# Patient Record
Sex: Female | Born: 1975 | Race: Black or African American | Hispanic: No | Marital: Single | Smoking: Former smoker
Health system: Southern US, Community
[De-identification: ages and names within clinical notes are randomized; demographics above are authoritative.]

## PROBLEM LIST (undated history)

## (undated) DIAGNOSIS — R21 Rash and other nonspecific skin eruption: Secondary | ICD-10-CM

## (undated) DIAGNOSIS — Z789 Other specified health status: Secondary | ICD-10-CM

## (undated) DIAGNOSIS — D649 Anemia, unspecified: Secondary | ICD-10-CM

## (undated) DIAGNOSIS — K469 Unspecified abdominal hernia without obstruction or gangrene: Secondary | ICD-10-CM

## (undated) DIAGNOSIS — B029 Zoster without complications: Secondary | ICD-10-CM

## (undated) HISTORY — DX: Unspecified abdominal hernia without obstruction or gangrene: K46.9

## (undated) HISTORY — DX: Zoster without complications: B02.9

---

## 1998-01-21 ENCOUNTER — Emergency Department (HOSPITAL_COMMUNITY): Admission: EM | Admit: 1998-01-21 | Discharge: 1998-01-21 | Payer: Self-pay | Admitting: Emergency Medicine

## 1998-03-11 ENCOUNTER — Ambulatory Visit (HOSPITAL_COMMUNITY): Admission: RE | Admit: 1998-03-11 | Discharge: 1998-03-11 | Payer: Self-pay | Admitting: Obstetrics & Gynecology

## 1998-03-23 ENCOUNTER — Ambulatory Visit (HOSPITAL_COMMUNITY): Admission: RE | Admit: 1998-03-23 | Discharge: 1998-03-23 | Payer: Self-pay | Admitting: *Deleted

## 1998-05-06 ENCOUNTER — Ambulatory Visit (HOSPITAL_COMMUNITY): Admission: RE | Admit: 1998-05-06 | Discharge: 1998-05-06 | Payer: Self-pay

## 1998-05-06 ENCOUNTER — Encounter: Payer: Self-pay | Admitting: *Deleted

## 1998-09-10 ENCOUNTER — Inpatient Hospital Stay (HOSPITAL_COMMUNITY): Admission: AD | Admit: 1998-09-10 | Discharge: 1998-09-13 | Payer: Self-pay | Admitting: Obstetrics and Gynecology

## 1998-10-07 ENCOUNTER — Other Ambulatory Visit: Admission: RE | Admit: 1998-10-07 | Discharge: 1998-10-07 | Payer: Self-pay | Admitting: Obstetrics and Gynecology

## 2000-10-17 ENCOUNTER — Encounter: Payer: Self-pay | Admitting: Emergency Medicine

## 2000-10-17 ENCOUNTER — Emergency Department (HOSPITAL_COMMUNITY): Admission: EM | Admit: 2000-10-17 | Discharge: 2000-10-17 | Payer: Self-pay | Admitting: Emergency Medicine

## 2002-03-07 ENCOUNTER — Encounter: Payer: Self-pay | Admitting: Emergency Medicine

## 2002-03-07 ENCOUNTER — Emergency Department (HOSPITAL_COMMUNITY): Admission: EM | Admit: 2002-03-07 | Discharge: 2002-03-07 | Payer: Self-pay | Admitting: Emergency Medicine

## 2006-04-10 DIAGNOSIS — D649 Anemia, unspecified: Secondary | ICD-10-CM

## 2006-04-10 HISTORY — DX: Anemia, unspecified: D64.9

## 2007-04-09 ENCOUNTER — Inpatient Hospital Stay (HOSPITAL_COMMUNITY): Admission: RE | Admit: 2007-04-09 | Discharge: 2007-04-13 | Payer: Self-pay | Admitting: Orthopedic Surgery

## 2007-04-14 ENCOUNTER — Encounter (HOSPITAL_COMMUNITY): Admission: RE | Admit: 2007-04-14 | Discharge: 2007-04-28 | Payer: Self-pay | Admitting: Obstetrics & Gynecology

## 2010-07-12 ENCOUNTER — Inpatient Hospital Stay (HOSPITAL_COMMUNITY)
Admission: AD | Admit: 2010-07-12 | Discharge: 2010-07-12 | Disposition: A | Discharge status: Home / Self Care | Payer: 59 | Source: Ambulatory Visit | Attending: Obstetrics and Gynecology | Admitting: Obstetrics and Gynecology

## 2010-07-12 DIAGNOSIS — R109 Unspecified abdominal pain: Secondary | ICD-10-CM | POA: Insufficient documentation

## 2010-07-12 LAB — URINALYSIS, ROUTINE W REFLEX MICROSCOPIC
Bilirubin Urine: NEGATIVE
Glucose, UA: NEGATIVE mg/dL
Hgb urine dipstick: NEGATIVE
Ketones, ur: NEGATIVE mg/dL
Nitrite: NEGATIVE
Protein, ur: NEGATIVE mg/dL
Specific Gravity, Urine: 1.025 (ref 1.005–1.030)
Urobilinogen, UA: 0.2 mg/dL (ref 0.0–1.0)
pH: 6 (ref 5.0–8.0)

## 2010-07-12 LAB — CBC
HCT: 33.8 % — ABNORMAL LOW (ref 36.0–46.0)
Hemoglobin: 12.1 g/dL (ref 12.0–15.0)
MCH: 30.5 pg (ref 26.0–34.0)
MCHC: 35.8 g/dL (ref 30.0–36.0)
MCV: 85.1 fL (ref 78.0–100.0)
Platelets: 217 10*3/uL (ref 150–400)
RBC: 3.97 MIL/uL (ref 3.87–5.11)
RDW: 13.3 % (ref 11.5–15.5)
WBC: 7 10*3/uL (ref 4.0–10.5)

## 2010-07-12 LAB — DIFFERENTIAL
Basophils Absolute: 0 10*3/uL (ref 0.0–0.1)
Basophils Relative: 0 % (ref 0–1)
Eosinophils Absolute: 0.2 10*3/uL (ref 0.0–0.7)
Eosinophils Relative: 2 % (ref 0–5)
Lymphocytes Relative: 33 % (ref 12–46)
Lymphs Abs: 2.3 10*3/uL (ref 0.7–4.0)
Monocytes Absolute: 0.4 10*3/uL (ref 0.1–1.0)
Monocytes Relative: 5 % (ref 3–12)
Neutro Abs: 4.1 10*3/uL (ref 1.7–7.7)
Neutrophils Relative %: 59 % (ref 43–77)

## 2010-08-15 LAB — RUBELLA ANTIBODY, IGM: Rubella: IMMUNE

## 2010-08-15 LAB — HEPATITIS B SURFACE ANTIGEN: Hepatitis B Surface Ag: NEGATIVE

## 2010-08-15 LAB — HIV ANTIBODY (ROUTINE TESTING W REFLEX): HIV: NONREACTIVE

## 2010-08-17 ENCOUNTER — Encounter (INDEPENDENT_AMBULATORY_CARE_PROVIDER_SITE_OTHER): Payer: Self-pay | Admitting: General Surgery

## 2010-08-23 NOTE — Discharge Summary (Signed)
NAME:  Nicole Humphrey, Nicole Humphrey NO.:  [id number]   MEDICAL RECORD NO.:  [id number]          PATIENT TYPE:  INP   LOCATION:  9103                          FACILITY:  [facility]   PHYSICIAN:  Gerrit Friends. Aldona Bar, M.D.   DATE OF BIRTH:  09/03/1975   DATE OF ADMISSION:  04/09/2007  DATE OF DISCHARGE:  04/13/2007                               DISCHARGE SUMMARY   DISCHARGE DIAGNOSES:  1. Term pregnancy delivered, 6 pounds 14 ounces female infant, Apgars      8 and 9.  2. Blood type A positive.  3. Previous cesarean section with desire for repeat.   PROCEDURE:  Repeat low transverse cesarean section.   SUMMARY:  This 35 year old gravida 2, para 1 was admitted at [redacted] weeks  gestation for repeat cesarean section.  Her pregnancy was relatively  uncomplicated.  Her preoperative hemoglobin was 10.7 with a white count  of 6400 and her postop hemoglobin was 8.8 with a white count of 5900 and  normal differential.  On the day of admission she did undergo a repeat  cesarean section with delivery of a 6 pounds 14 ounces female with  Apgars of 8 and 9.  Mother's postoperative course was relatively  uncomplicated although she did have some mild distention until  approximately the evening of the third postoperative day when bowel  function returned and her distention became less.  On the day of  discharge her staples were removed and wound was Steri-Stripped with  Benzoin.  On the day of discharge her vital signs remained stable -  remained afebrile during her hospital course.  At the time of discharge  she was having normal bowel and bladder function and she was tolerating  p.o. Dilaudid for analgesia well.  Breast-feeding, although she had some  initial difficulty with it, was progressing well.  On the afternoon of  January 3 she was desirous of discharge and was discharged to home.   DISCHARGE MEDICATIONS:  Include vitamins one a day as long as she is  breast-feeding, Feosol capsules - one daily,  Dilaudid 2 mg tablets one  every 3-4 hours as needed for severe pain and Motrin 600 mg one every 6  hours as needed for less severe pain and cramping.   FOLLOWUP:  She will return to the office for followup in approximately 4  weeks' time or as needed.  Instruction brochure was given to the patient  at the time of discharge and all the instructions were understood well.   CONDITION ON DISCHARGE:  Improved.      Gerrit Friends. Aldona Bar, M.D.  Electronically Signed     RMW/MEDQ  D:  04/13/2007  T:  04/13/2007  Job:  [id number]

## 2010-08-23 NOTE — Op Note (Signed)
NAME:  Nicole Humphrey, Nicole Humphrey NO.:  [id number]   MEDICAL RECORD NO.:  [id number]          PATIENT TYPE:  INP   LOCATION:  9103                          [facility]ACILITY:  [facility]   PHYSICIAN:  Gerrit Friends. Aldona Bar, M.D.   DATE OF BIRTH:  [birth year]   DATE OF PROCEDURE:  04/09/2007  DATE OF DISCHARGE:                               OPERATIVE REPORT   The patient is age 35.   PREOPERATIVE DIAGNOSES:  1. Term pregnancy.  2. Previous cesarean section.  3. Desire for repeat cesarean.   POSTOPERATIVE DIAGNOSES:  1. Term pregnancy.  2. Previous cesarean section.  3. Desire for repeat cesarean.  4. Delivery of 6 pound 14 ounce female infant, Apgars 8 and 9.   PROCEDURE:  Repeat low transverse cesarean section.   SURGEON:  Dr. Aldona Bar.   ANESTHESIA:  Spinal, Dr. Arby Barrette.   HISTORY:  This 35 year old, gravida 2, para 1 had a previous cesarean  section at term and during this pregnancy did well and requests a repeat  cesarean section for delivery.  Her due date is April 16, 2007.  She is  now taken to the operating room for delivery by repeat cesarean section.   DESCRIPTION OF PROCEDURE:  The patient was taken to the operating room  where after the satisfactory induction of spinal anesthetic by Dr.  Arby Barrette was carried out, she was prepped and draped in the usual  fashion with a Foley catheter placed.   After the abdomen was prepped and draped and anesthetic levels checked  and found to be very adequate, the procedure was begun.  A Pfannenstiel  incision was made through the old scar dissecting down sharply to and  through the fascia in a low transverse fashion with hemostasis created  at each layer.  A subfascial space was created inferiorly and superiorly  with care taken to avoid the bowel superiorly and the bladder  inferiorly.  There were some adhesions involving the lower segment in  the anterior peritoneum and in addition, the omentum and the lower  segment. These were sharply  taken down.   After adequate exposure, the vesicouterine peritoneum was incised in a  low transverse fashion, pushed off the lower uterine segment and then  using Metzenbaum scissors a low transverse incision was made, amniotomy  produced with production of a minimal amount of clear fluid and  thereafter with the aid of the vacuum extractor delivery of a viable  female infant which cried spontaneously once it was carried out from a  vertex position.  The subsequent weight was found to be 6 pounds and 14  ounces, Apgar 8 and 9 and the infant ultimately was taken to the nursery  in good condition.   After the cord bloods were collected, the placenta was delivered intact.  The uterus was then exteriorized and rendered free of any remaining  products of conception.  It was possible to feel a submucosal myoma in  the upper fundal portion on the right.  This felt to measure  approximately 3 cm in diameter.  With the uterus well contracted,  closure of the uterine incision was  then carried out using #1 Vicryl in  a running locking fashion from angle to angle. Several additional figure-  of-eight sutures were applied of #1 Vicryl for additional hemostasis.  Once the uterine incision was rendered dry, attention was turned to both  tubes and ovaries which appeared normal.  Again with the uterus well  contracted and the uterine incision dry, the abdomen was lavaged of all  free blood and clot and with all counts being correct and no foreign  bodies noted to be remaining in the abdominal cavity, the uterus was  replaced into the abdomen and lavage carried out. The uterine incision  again was noted to be dry. At this time closure of the abdomen was  carried out in layers.  The abdominal peritoneum was closed with #0  Vicryl from in a running fashion and muscles were then closed with same.  The subfascial space was inspected and noted to be dry - several figure-  of-eight 3-0 Vicryls were placed in  the muscle for hemostasis.  Once  good hemostasis was noted, the fascia was then reapproximated from angle  to midline bilaterally using #0 Vicryl in a running fashion.  The  subcutaneous tissue was rendered hemostatic and staples were then used  to close the skin. A sterile pressure dressing was applied and the  patient at this time was transported to the recovery room in  satisfactory addition having tolerated the procedure well.  Estimated  blood loss 500 mL.  All counts correct x2.  At the conclusion of the  procedure, both mother and baby were doing well in their respective  recovery areas.  All counts correct x2.      Gerrit Friends. Aldona Bar, M.D.  Electronically Signed     RMW/MEDQ  D:  04/09/2007  T:  04/09/2007  Job:  [id number]

## 2010-11-21 ENCOUNTER — Ambulatory Visit (INDEPENDENT_AMBULATORY_CARE_PROVIDER_SITE_OTHER): Payer: 59 | Admitting: General Surgery

## 2010-11-21 ENCOUNTER — Encounter (INDEPENDENT_AMBULATORY_CARE_PROVIDER_SITE_OTHER): Payer: Self-pay | Admitting: General Surgery

## 2010-11-21 VITALS — BP 98/64 | HR 64 | Temp 97.1°F

## 2010-11-21 DIAGNOSIS — K439 Ventral hernia without obstruction or gangrene: Secondary | ICD-10-CM

## 2010-11-21 DIAGNOSIS — K429 Umbilical hernia without obstruction or gangrene: Secondary | ICD-10-CM

## 2010-11-21 NOTE — Patient Instructions (Signed)
You have two separate hernias. The upper one is called an epigastric hernia, and the lower one is called an umbilical hernia. I think that we should not repair the hernias at the time of your C-section. I think we should wait a few months until you have your muscle tone back, and then consider laparoscopic repair with mesh. I think that we will get a better result with that. I will ask you to return to see me in 3-4 months, after you have delivered your child.Marland Kitchen

## 2010-11-21 NOTE — Progress Notes (Signed)
Subjective:     Patient ID: Nicole Humphrey, female   DOB: [birth year], 35 y.o.   MRN: [id number]  HPI This pleasant 35 year old Latin American female was referred back to me by Dr. Osborn Coho to consider repair of her umbilical hernia at the time of her planned C-section.  I had previously evaluated this patient in April of this year for hernias around the umbilicus. She states that the hernias are not  any more painful and do not  feel as bad as they did back in April. Dr. Su Hilt asks if  we should repair the hernias at the same time as her C-section. There's been no history of incarceration or strangulation.  The patient states that the date of the planned, elective C-section is between October 9 and October 12.  Past Medical History  Diagnosis Date  . Hernia     Current Outpatient Prescriptions  Medication Sig Dispense Refill  . diphenhydramine-acetaminophen (TYLENOL PM EXTRA STRENGTH) 25-500 MG TABS Take 1 tablet by mouth as needed.        Marland Kitchen oxycodone-acetaminophen (LYNOX) 10-300 MG per tablet Take by mouth every 4 (four) hours as needed.         History  Substance Use Topics  . Smoking status: Never Smoker   . Smokeless tobacco: Not on file  . Alcohol Use: No    History reviewed. No pertinent family history.   Review of Systems 10 system review of systems is performed and is negative except as described above.    Objective:   Physical Exam The patient looks well and is in no distress.  Abdomen gravid. Soft and nontender otherwise. She is examined supine and standing. She has 2 hernias. The umbilicus  has a defect approximately 2 cm or less. In the upper midline about 4 cm above the umbilicus there is a separate epigastric hernia. This is larger with a hernia sac at least 6 cm in size but the defect was much smaller.    Assessment:     Umbilical hernia.  Epigastric hernia.  Intrauterine pregnancy.    Plan:     I have advised the patient that we should  delay her hernia repairs until after her delivery and C-section and when her muscle tone has returned. I advised her that I think that we will be able to do a much better job. I told her that most likely we could do a laparoscopic repair with mesh.  If we do hernia repairs at the time of her elective C-section, then almost certainly we will have to make a large vertical incision, and suspect that the long-term result would be not as good.  She agrees with this plan. She'll return to see me in 3-4 months after she has delivered.

## 2010-11-24 ENCOUNTER — Other Ambulatory Visit: Payer: Self-pay | Admitting: Obstetrics and Gynecology

## 2010-11-30 ENCOUNTER — Other Ambulatory Visit: Payer: Self-pay | Admitting: Obstetrics and Gynecology

## 2010-12-29 LAB — COMPREHENSIVE METABOLIC PANEL
ALT: 42 — ABNORMAL HIGH
AST: 41 — ABNORMAL HIGH
Albumin: 2.3 — ABNORMAL LOW
Alkaline Phosphatase: 103
Chloride: 106
GFR calc Af Amer: >60
Potassium: 3.8
Total Bilirubin: 0.8

## 2010-12-29 LAB — CBC
Platelets: 215
RBC: 3.25 — ABNORMAL LOW
WBC: 5.9

## 2010-12-29 LAB — DIFFERENTIAL
Eosinophils Absolute: 0.2
Eosinophils Relative: 4
Lymphs Abs: 1.5
Monocytes Relative: 5

## 2011-01-09 ENCOUNTER — Encounter (HOSPITAL_COMMUNITY): Payer: Self-pay

## 2011-01-10 ENCOUNTER — Encounter (HOSPITAL_COMMUNITY): Payer: Self-pay

## 2011-01-10 ENCOUNTER — Encounter (HOSPITAL_COMMUNITY)
Admission: RE | Admit: 2011-01-10 | Discharge: 2011-01-10 | Disposition: A | Discharge status: Home / Self Care | Payer: 59 | Source: Ambulatory Visit | Attending: Obstetrics and Gynecology | Admitting: Obstetrics and Gynecology

## 2011-01-10 HISTORY — DX: Other specified health status: Z78.9

## 2011-01-10 LAB — SURGICAL PCR SCREEN: Staphylococcus aureus: NEGATIVE

## 2011-01-10 LAB — CBC
MCV: 85.6 fL (ref 78.0–100.0)
Platelets: 182 10*3/uL (ref 150–400)
RDW: 13.8 % (ref 11.5–15.5)
WBC: 7 10*3/uL (ref 4.0–10.5)

## 2011-01-10 NOTE — Patient Instructions (Signed)
   Your procedure is scheduled on:01/17/11  Enter through the Main Entrance of [facility] at:0800 Pick up the phone at the desk and dial [phone] and inform us of your arrival  Please call this number if you have any problems the morning of surgery: [phone]  Remember: Do not eat food after midnight none Do not drink clear liquids after:none Take these medicines the morning of surgery with a SIP OF WATER:none  Do not wear jewelry, make-up, or FINGER nail polish Do not wear lotions, powders, or perfumes.  Do not shave 48 hours prior to surgery. Do not bring valuables to the hospital. Leave suitcase in the car. After Surgery it may be brought to your room. For patients being admitted to the hospital, checkout time is 11:00am the day of discharge.  Patients discharged on the day of surgery will not be allowed to drive home.   Name and phone number of your driver: Doristine Counter- [phone]  Remember to use your hibiclens as instructed.Please shower with 1/2 bottle the evening before your surgery and the other 1/2 bottle the morning of surgery.

## 2011-01-13 LAB — RPR: RPR Ser Ql: NONREACTIVE

## 2011-01-13 LAB — CBC
Hemoglobin: 10.7 — ABNORMAL LOW
MCV: 78.6
RBC: 3.31 — ABNORMAL LOW
RDW: 15.3
WBC: 8.8

## 2011-01-17 ENCOUNTER — Other Ambulatory Visit: Payer: Self-pay | Admitting: Obstetrics and Gynecology

## 2011-01-17 ENCOUNTER — Inpatient Hospital Stay (HOSPITAL_COMMUNITY)
Admission: RE | Admit: 2011-01-17 | Discharge: 2011-01-20 | DRG: 766 | Disposition: A | Discharge status: Home / Self Care | Payer: 59 | Source: Ambulatory Visit | Attending: Obstetrics and Gynecology | Admitting: Obstetrics and Gynecology

## 2011-01-17 ENCOUNTER — Inpatient Hospital Stay (HOSPITAL_COMMUNITY): Payer: 59 | Admitting: Anesthesiology

## 2011-01-17 ENCOUNTER — Encounter (HOSPITAL_COMMUNITY): Payer: Self-pay | Admitting: Anesthesiology

## 2011-01-17 ENCOUNTER — Encounter (HOSPITAL_COMMUNITY): Payer: Self-pay | Admitting: *Deleted

## 2011-01-17 ENCOUNTER — Encounter (HOSPITAL_COMMUNITY)
Admission: RE | Disposition: A | Discharge status: Home / Self Care | Payer: Self-pay | Source: Ambulatory Visit | Attending: Obstetrics and Gynecology

## 2011-01-17 DIAGNOSIS — O09529 Supervision of elderly multigravida, unspecified trimester: Secondary | ICD-10-CM | POA: Diagnosis present

## 2011-01-17 DIAGNOSIS — O34219 Maternal care for unspecified type scar from previous cesarean delivery: Principal | ICD-10-CM | POA: Diagnosis present

## 2011-01-17 DIAGNOSIS — K439 Ventral hernia without obstruction or gangrene: Secondary | ICD-10-CM | POA: Diagnosis present

## 2011-01-17 DIAGNOSIS — O99893 Other specified diseases and conditions complicating puerperium: Secondary | ICD-10-CM | POA: Diagnosis not present

## 2011-01-17 DIAGNOSIS — R142 Eructation: Secondary | ICD-10-CM | POA: Diagnosis not present

## 2011-01-17 DIAGNOSIS — Z01812 Encounter for preprocedural laboratory examination: Secondary | ICD-10-CM

## 2011-01-17 DIAGNOSIS — Z01818 Encounter for other preprocedural examination: Secondary | ICD-10-CM

## 2011-01-17 DIAGNOSIS — R141 Gas pain: Secondary | ICD-10-CM | POA: Diagnosis not present

## 2011-01-17 DIAGNOSIS — K429 Umbilical hernia without obstruction or gangrene: Secondary | ICD-10-CM | POA: Diagnosis present

## 2011-01-17 DIAGNOSIS — Z302 Encounter for sterilization: Secondary | ICD-10-CM

## 2011-01-17 DIAGNOSIS — R140 Abdominal distension (gaseous): Secondary | ICD-10-CM

## 2011-01-17 DIAGNOSIS — Z98891 History of uterine scar from previous surgery: Secondary | ICD-10-CM

## 2011-01-17 DIAGNOSIS — R143 Flatulence: Secondary | ICD-10-CM | POA: Diagnosis not present

## 2011-01-17 DIAGNOSIS — IMO0002 Reserved for concepts with insufficient information to code with codable children: Secondary | ICD-10-CM

## 2011-01-17 HISTORY — PX: SCAR REVISION: SHX5285

## 2011-01-17 LAB — TYPE AND SCREEN
ABO/RH(D): A POS
Antibody Screen: NEGATIVE

## 2011-01-17 LAB — ABO/RH: ABO/RH(D): A POS

## 2011-01-17 SURGERY — Surgical Case
Anesthesia: Regional | Site: Uterus | Wound class: Clean Contaminated

## 2011-01-17 MED ORDER — IBUPROFEN 600 MG PO TABS
600.0000 mg | ORAL_TABLET | Freq: Four times a day (QID) | ORAL | Status: DC
  Administered 2011-01-17 – 2011-01-20 (×12): 600 mg via ORAL
  Filled 2011-01-17 (×4): qty 1

## 2011-01-17 MED ORDER — ONDANSETRON HCL 4 MG PO TABS: 4.0000 mg | ORAL_TABLET | ORAL | Status: DC | PRN

## 2011-01-17 MED ORDER — ONDANSETRON HCL 4 MG/2ML IJ SOLN
INTRAMUSCULAR | Status: AC
  Filled 2011-01-17: qty 2

## 2011-01-17 MED ORDER — EPHEDRINE 5 MG/ML INJ
INTRAVENOUS | Status: AC
  Filled 2011-01-17: qty 10

## 2011-01-17 MED ORDER — SCOPOLAMINE 1 MG/3DAYS TD PT72
1.0000 | MEDICATED_PATCH | Freq: Once | TRANSDERMAL | Status: DC
  Administered 2011-01-17: 1.5 mg via TRANSDERMAL

## 2011-01-17 MED ORDER — MEPERIDINE HCL 25 MG/ML IJ SOLN
INTRAMUSCULAR | Status: AC
  Administered 2011-01-17: 6.25 mg via INTRAVENOUS
  Filled 2011-01-17: qty 1

## 2011-01-17 MED ORDER — ONDANSETRON HCL 4 MG/2ML IJ SOLN: 4.0000 mg | Freq: Three times a day (TID) | INTRAMUSCULAR | Status: DC | PRN

## 2011-01-17 MED ORDER — ONDANSETRON HCL 4 MG/2ML IJ SOLN
INTRAMUSCULAR | Status: DC | PRN
  Administered 2011-01-17: 4 mg via INTRAVENOUS

## 2011-01-17 MED ORDER — MENTHOL 3 MG MT LOZG: 1.0000 | LOZENGE | OROMUCOSAL | Status: DC | PRN

## 2011-01-17 MED ORDER — LACTATED RINGERS IV SOLN
INTRAVENOUS | Status: DC
  Administered 2011-01-17 (×4): via INTRAVENOUS

## 2011-01-17 MED ORDER — KETOROLAC TROMETHAMINE 60 MG/2ML IM SOLN
60.0000 mg | Freq: Once | INTRAMUSCULAR | Status: AC | PRN
  Administered 2011-01-17: 60 mg via INTRAMUSCULAR

## 2011-01-17 MED ORDER — PHENYLEPHRINE 40 MCG/ML (10ML) SYRINGE FOR IV PUSH (FOR BLOOD PRESSURE SUPPORT)
PREFILLED_SYRINGE | INTRAVENOUS | Status: AC
  Filled 2011-01-17: qty 5

## 2011-01-17 MED ORDER — WITCH HAZEL-GLYCERIN EX PADS: 1.0000 "application " | MEDICATED_PAD | CUTANEOUS | Status: DC | PRN

## 2011-01-17 MED ORDER — NALBUPHINE HCL 10 MG/ML IJ SOLN
5.0000 mg | INTRAMUSCULAR | Status: DC | PRN
  Filled 2011-01-17: qty 1

## 2011-01-17 MED ORDER — LIDOCAINE HCL 1 % IJ SOLN
INTRAMUSCULAR | Status: DC | PRN
  Administered 2011-01-17: 20 mL

## 2011-01-17 MED ORDER — MEPERIDINE HCL 25 MG/ML IJ SOLN
6.2500 mg | INTRAMUSCULAR | Status: DC | PRN
  Administered 2011-01-17: 6.25 mg via INTRAVENOUS

## 2011-01-17 MED ORDER — CEFAZOLIN SODIUM 1-5 GM-% IV SOLN
INTRAVENOUS | Status: AC
  Filled 2011-01-17: qty 50

## 2011-01-17 MED ORDER — LACTATED RINGERS IV SOLN
INTRAVENOUS | Status: DC
  Administered 2011-01-17: 16:00:00 via INTRAVENOUS

## 2011-01-17 MED ORDER — ONDANSETRON HCL 4 MG/2ML IJ SOLN: 4.0000 mg | INTRAMUSCULAR | Status: DC | PRN

## 2011-01-17 MED ORDER — SCOPOLAMINE 1 MG/3DAYS TD PT72
MEDICATED_PATCH | TRANSDERMAL | Status: AC
  Administered 2011-01-17: 1.5 mg via TRANSDERMAL
  Filled 2011-01-17: qty 1

## 2011-01-17 MED ORDER — FENTANYL CITRATE 0.05 MG/ML IJ SOLN
INTRAMUSCULAR | Status: DC | PRN
  Administered 2011-01-17: .15 ug via INTRATHECAL

## 2011-01-17 MED ORDER — DIBUCAINE 1 % RE OINT: 1.0000 "application " | TOPICAL_OINTMENT | RECTAL | Status: DC | PRN

## 2011-01-17 MED ORDER — FENTANYL CITRATE 0.05 MG/ML IJ SOLN
INTRAMUSCULAR | Status: AC
  Filled 2011-01-17: qty 2

## 2011-01-17 MED ORDER — BUPIVACAINE IN DEXTROSE 0.75-8.25 % IT SOLN
INTRATHECAL | Status: DC | PRN
  Administered 2011-01-17: 1.5 mg via INTRATHECAL

## 2011-01-17 MED ORDER — LANOLIN HYDROUS EX OINT: 1.0000 "application " | TOPICAL_OINTMENT | CUTANEOUS | Status: DC | PRN

## 2011-01-17 MED ORDER — TRIAMCINOLONE ACETONIDE 40 MG/ML IJ SUSP
40.0000 mg | Freq: Once | INTRAMUSCULAR | Status: AC
  Administered 2011-01-17: 40 mg via INTRAMUSCULAR
  Filled 2011-01-17: qty 1

## 2011-01-17 MED ORDER — ZOLPIDEM TARTRATE 5 MG PO TABS: 5.0000 mg | ORAL_TABLET | Freq: Every evening | ORAL | Status: DC | PRN

## 2011-01-17 MED ORDER — PHENYLEPHRINE HCL 10 MG/ML IJ SOLN
INTRAMUSCULAR | Status: DC | PRN
  Administered 2011-01-17: 40 ug via INTRAVENOUS
  Administered 2011-01-17 (×2): 80 ug via INTRAVENOUS
  Administered 2011-01-17 (×3): 40 ug via INTRAVENOUS

## 2011-01-17 MED ORDER — SODIUM CHLORIDE 0.9 % IJ SOLN: 3.0000 mL | INTRAMUSCULAR | Status: DC | PRN

## 2011-01-17 MED ORDER — OXYTOCIN 10 UNIT/ML IJ SOLN
20.0000 [IU] | INTRAVENOUS | Status: DC | PRN
  Administered 2011-01-17: 20 [IU] via INTRAVENOUS

## 2011-01-17 MED ORDER — SODIUM CHLORIDE 0.9 % IV SOLN
1.0000 ug/kg/h | INTRAVENOUS | Status: DC | PRN
  Filled 2011-01-17: qty 2.5

## 2011-01-17 MED ORDER — SIMETHICONE 80 MG PO CHEW: 80.0000 mg | CHEWABLE_TABLET | ORAL | Status: DC | PRN

## 2011-01-17 MED ORDER — FENTANYL CITRATE 0.05 MG/ML IJ SOLN
INTRAMUSCULAR | Status: DC | PRN
  Administered 2011-01-17: 85 ug via INTRAVENOUS

## 2011-01-17 MED ORDER — KETOROLAC TROMETHAMINE 30 MG/ML IJ SOLN: 30.0000 mg | Freq: Four times a day (QID) | INTRAMUSCULAR | Status: AC | PRN

## 2011-01-17 MED ORDER — PRENATAL PLUS 27-1 MG PO TABS
1.0000 | ORAL_TABLET | Freq: Every day | ORAL | Status: DC
  Administered 2011-01-18 – 2011-01-20 (×2): 1 via ORAL
  Filled 2011-01-17 (×2): qty 1

## 2011-01-17 MED ORDER — KETOROLAC TROMETHAMINE 30 MG/ML IJ SOLN
INTRAMUSCULAR | Status: AC
  Filled 2011-01-17: qty 1

## 2011-01-17 MED ORDER — TETANUS-DIPHTH-ACELL PERTUSSIS 5-2.5-18.5 LF-MCG/0.5 IM SUSP: 0.5000 mL | Freq: Once | INTRAMUSCULAR | Status: DC

## 2011-01-17 MED ORDER — OXYTOCIN 10 UNIT/ML IJ SOLN
INTRAMUSCULAR | Status: AC
  Filled 2011-01-17: qty 2

## 2011-01-17 MED ORDER — FENTANYL CITRATE 0.05 MG/ML IJ SOLN
25.0000 ug | INTRAMUSCULAR | Status: DC | PRN
  Administered 2011-01-17 (×4): 25 ug via INTRAVENOUS

## 2011-01-17 MED ORDER — KETOROLAC TROMETHAMINE 60 MG/2ML IM SOLN
INTRAMUSCULAR | Status: AC
  Administered 2011-01-17: 60 mg via INTRAMUSCULAR
  Filled 2011-01-17: qty 2

## 2011-01-17 MED ORDER — MORPHINE SULFATE 0.5 MG/ML IJ SOLN
INTRAMUSCULAR | Status: AC
  Filled 2011-01-17: qty 10

## 2011-01-17 MED ORDER — SCOPOLAMINE 1 MG/3DAYS TD PT72
1.0000 | MEDICATED_PATCH | Freq: Once | TRANSDERMAL | Status: DC
  Filled 2011-01-17: qty 1

## 2011-01-17 MED ORDER — NALOXONE HCL 0.4 MG/ML IJ SOLN: 0.4000 mg | INTRAMUSCULAR | Status: DC | PRN

## 2011-01-17 MED ORDER — SENNOSIDES-DOCUSATE SODIUM 8.6-50 MG PO TABS
2.0000 | ORAL_TABLET | Freq: Every day | ORAL | Status: DC
  Administered 2011-01-17 – 2011-01-19 (×3): 2 via ORAL

## 2011-01-17 MED ORDER — OXYCODONE-ACETAMINOPHEN 5-325 MG PO TABS
1.0000 | ORAL_TABLET | ORAL | Status: DC | PRN
  Administered 2011-01-17 – 2011-01-18 (×5): 2 via ORAL
  Administered 2011-01-19 – 2011-01-20 (×4): 1 via ORAL
  Filled 2011-01-17: qty 1
  Filled 2011-01-17: qty 2
  Filled 2011-01-17: qty 1
  Filled 2011-01-17 (×3): qty 2
  Filled 2011-01-17 (×2): qty 1
  Filled 2011-01-17: qty 2

## 2011-01-17 MED ORDER — MORPHINE SULFATE (PF) 0.5 MG/ML IJ SOLN
INTRAMUSCULAR | Status: DC | PRN
  Administered 2011-01-17: .1 ug via INTRATHECAL

## 2011-01-17 MED ORDER — DIPHENHYDRAMINE HCL 25 MG PO CAPS: 25.0000 mg | ORAL_CAPSULE | Freq: Four times a day (QID) | ORAL | Status: DC | PRN

## 2011-01-17 MED ORDER — IBUPROFEN 600 MG PO TABS
600.0000 mg | ORAL_TABLET | Freq: Four times a day (QID) | ORAL | Status: DC | PRN
  Filled 2011-01-17 (×8): qty 1

## 2011-01-17 MED ORDER — DIPHENHYDRAMINE HCL 50 MG/ML IJ SOLN: 25.0000 mg | INTRAMUSCULAR | Status: DC | PRN

## 2011-01-17 MED ORDER — SIMETHICONE 80 MG PO CHEW
80.0000 mg | CHEWABLE_TABLET | Freq: Three times a day (TID) | ORAL | Status: DC
  Administered 2011-01-17 – 2011-01-20 (×11): 80 mg via ORAL

## 2011-01-17 MED ORDER — DIPHENHYDRAMINE HCL 50 MG/ML IJ SOLN: 12.5000 mg | INTRAMUSCULAR | Status: DC | PRN

## 2011-01-17 MED ORDER — OXYTOCIN 20 UNITS IN LACTATED RINGERS INFUSION - SIMPLE: 125.0000 mL/h | INTRAVENOUS | Status: AC

## 2011-01-17 MED ORDER — CEFAZOLIN SODIUM 1-5 GM-% IV SOLN
1.0000 g | Freq: Once | INTRAVENOUS | Status: AC
  Administered 2011-01-17: 1 g via INTRAVENOUS

## 2011-01-17 MED ORDER — METOCLOPRAMIDE HCL 5 MG/ML IJ SOLN: 10.0000 mg | Freq: Three times a day (TID) | INTRAMUSCULAR | Status: DC | PRN

## 2011-01-17 MED ORDER — FENTANYL CITRATE 0.05 MG/ML IJ SOLN
INTRAMUSCULAR | Status: AC
  Administered 2011-01-17: 25 ug via INTRAVENOUS
  Filled 2011-01-17: qty 2

## 2011-01-17 MED ORDER — DIPHENHYDRAMINE HCL 25 MG PO CAPS
25.0000 mg | ORAL_CAPSULE | ORAL | Status: DC | PRN
  Administered 2011-01-17 – 2011-01-18 (×2): 25 mg via ORAL
  Filled 2011-01-17 (×2): qty 1

## 2011-01-17 SURGICAL SUPPLY — 38 items
APL SKNCLS STERI-STRIP NONHPOA (GAUZE/BANDAGES/DRESSINGS) ×2
BENZOIN TINCTURE PRP APPL 2/3 (GAUZE/BANDAGES/DRESSINGS) ×3 IMPLANT
BLADE SURG 10 STRL SS (BLADE) ×2 IMPLANT
CHLORAPREP W/TINT 26ML (MISCELLANEOUS) ×3 IMPLANT
CLOTH BEACON ORANGE TIMEOUT ST (SAFETY) ×3 IMPLANT
CONTAINER PREFILL 10% NBF 15ML (MISCELLANEOUS) ×2 IMPLANT
DRESSING TELFA 8X3 (GAUZE/BANDAGES/DRESSINGS) ×1 IMPLANT
DRSG COVADERM 4X8 (GAUZE/BANDAGES/DRESSINGS) ×1 IMPLANT
ELECT REM PT RETURN 9FT ADLT (ELECTROSURGICAL) ×3
ELECTRODE REM PT RTRN 9FT ADLT (ELECTROSURGICAL) ×2 IMPLANT
GLOVE BIO SURGEON STRL SZ7.5 (GLOVE) ×6 IMPLANT
GLOVE BIOGEL PI IND STRL 7.5 (GLOVE) ×2 IMPLANT
GLOVE BIOGEL PI INDICATOR 7.5 (GLOVE) ×1
GOWN PREVENTION PLUS LG XLONG (DISPOSABLE) ×7 IMPLANT
GOWN STRL REIN XL XLG (GOWN DISPOSABLE) ×4 IMPLANT
HEMOSTAT SURGICEL 4X8 (HEMOSTASIS) ×1 IMPLANT
NEEDLE HYPO 22GX1.5 SAFETY (NEEDLE) ×1 IMPLANT
NS IRRIG 1000ML POUR BTL (IV SOLUTION) ×3 IMPLANT
PACK C SECTION WH (CUSTOM PROCEDURE TRAY) ×3 IMPLANT
RETRACTOR WND ALEXIS 25 LRG (MISCELLANEOUS) ×2 IMPLANT
RTRCTR WOUND ALEXIS 25CM LRG (MISCELLANEOUS) ×3
SLEEVE SCD COMPRESS KNEE MED (MISCELLANEOUS) IMPLANT
STRIP CLOSURE SKIN 1/2X4 (GAUZE/BANDAGES/DRESSINGS) ×3 IMPLANT
SUT CHROMIC 2 0 CT 1 (SUTURE) ×3 IMPLANT
SUT MNCRL AB 3-0 PS2 27 (SUTURE) ×3 IMPLANT
SUT PLAIN 0 NONE (SUTURE) ×2 IMPLANT
SUT PLAIN 2 0 (SUTURE) ×3
SUT PLAIN 2 0 XLH (SUTURE) ×2 IMPLANT
SUT PLAIN ABS 2-0 CT1 27XMFL (SUTURE) IMPLANT
SUT VIC AB 0 CT1 36 (SUTURE) ×3 IMPLANT
SUT VIC AB 0 CTX 36 (SUTURE) ×6
SUT VIC AB 0 CTX36XBRD ANBCTRL (SUTURE) ×6 IMPLANT
SUT VIC AB 3-0 SH 27 (SUTURE) ×6
SUT VIC AB 3-0 SH 27XBRD (SUTURE) IMPLANT
SYR CONTROL 10ML LL (SYRINGE) ×1 IMPLANT
TOWEL OR 17X24 6PK STRL BLUE (TOWEL DISPOSABLE) ×6 IMPLANT
TRAY FOLEY CATH 14FR (SET/KITS/TRAYS/PACK) ×3 IMPLANT
WATER STERILE IRR 1000ML POUR (IV SOLUTION) ×3 IMPLANT

## 2011-01-17 NOTE — Anesthesia Procedure Notes (Signed)
Spinal Block  Patient location during procedure: OR Start time: 01/17/2011 9:52 AM Staffing Performed by: anesthesiologist  Preanesthetic Checklist Completed: patient identified, site marked, surgical consent, pre-op evaluation, timeout performed, IV checked, risks and benefits discussed and monitors and equipment checked Spinal Block Patient position: sitting Prep: site prepped and draped and DuraPrep Patient monitoring: heart rate, cardiac monitor, continuous pulse ox and blood pressure Approach: midline Location: L3-4 Injection technique: single-shot Needle Needle type: Sprotte  Needle gauge: 24 G Needle length: 9 cm Assessment Sensory level: T4

## 2011-01-17 NOTE — Op Note (Signed)
Cesarean Section Procedure Note  Indications: 39 wks for repeat c-section, permanent sterilization and revision of scar  Pre-operative Diagnosis: 1. Previous Cesearean Section; 2.Desires Sterilization 3.Desire for Scar Revision   Post-operative Diagnosis: Previous Cesearean Section; Desires Sterilization 3.Desire for Scar Revision  Procedure: CESAREAN SECTION WITH BILATERAL TUBAL LIGATION and Scar Revision  Surgeon: Purcell Nails, MD    Assistants: Denny Levy, CNM  Anesthesia: Regional (Spinal)  Anesthesiologist: Dr. Rodman Pickle  Procedure Details  The patient was taken to the operating room after the risks, benefits, complications, treatment options, and expected outcomes were discussed with the patient.  The patient concurred with the proposed plan, giving informed consent. The patient was taken to Operating Room C-Section Suite, identified as Nicole Humphrey and the procedure verified as C-Section Delivery. A Time Out was held and the above information confirmed.  After induction of anesthesia by obtaining a spinal, the patient was prepped and draped in the usual sterile manner. After testing the anesthesia level, a Pfannenstiel skin incision was made and carried down through the subcutaneous tissue to the underlying layer of fascia.  The fascia was incised bilaterally and extended transversely bilaterally with the Mayo scissors. Kocher clamps were placed on the inferior aspect of the fascial incision and the underlying rectus muscle was separated from the fascia. The same was done on the superior aspect of the fascial incision.  The peritoneum was identified, entered sharply and extended sharply and manually. The bladder was scarred up to the lower uterine section. A low transverse uterine incision was made above the level of the scarring with the scalpel and extended bilaterally with the bandage scissors.  The infant was delivered in the vertex position without difficulty.   After the umbilical cord was clamped and cut, the infant was handed to the awaiting pediatricians.  Cord blood was obtained for evaluation by the Cord blood bank collection team after handing off the placenta.  The placenta was removed intact and appeared to be within normal limits. The uterus was cleared of all clots and debris. The uterine incision was closed with running interlocking sutures of 0 Vicryl and a second imbricating layer was performed as well.   Bilateral tubes and ovaries appeared to be within normal limits.  The left fallopian tube was identified and carried out to its fimbriated end.  2-0 plain sutures were used to ligate the tube twice in the mid-portion after grasping with babcock clamp.  The tube was excised and sent to pathology.  The remaining pedicle was cauterized with the bovie.  Good hemostasis was noted.  The contralateral side was done in a similar fashion.  Copious irrigation was performed until clear. The peritoneum was repaired with 2-0 chromic via a running suture.  The fascia was reapproximated with a running suture of 0 Vicryl. The subcutaneous tissue was reapproximated with 3 interrupted sutures of 2-0 plain.  The prior scar on the skin was excised.  The skin was reapproximated with a subcuticular suture of 3-0 monocryl.  60mg  of Kenalog in 20cc of lidocaine was injected to prevent hypertrophy.  The patient consented to this while on the table prior to the start of the case.  Half inch steristrips were applied with benzoine and dressing applied as well.  Instrument, sponge, and needle counts were correct prior to abdominal closure and at the conclusion of the case.  The patient was awaiting transfer to the recovery room in good condition.  Findings: Live female infant with Apgars 9 at one minute and  9 at five minutes.  Normal appearing bilateral ovaries and fallopian tubes were noted.  Estimated Blood Loss:          Drains: foley to gravity with           Total IV Fluids:         Specimens to Pathology: Placenta and bilateral portions of fallopian tubes         Complications:  None; patient tolerated the procedure well.         Disposition: PACU - hemodynamically stable.         Condition: stable  Attending Attestation: I performed the procedure.

## 2011-01-17 NOTE — Consult Note (Signed)
Called to attend repeat elective C/S at term gestation with no reported risk factors. AROM (clear) at time of delivery.  Infant in vertex and manually extracted with lusty cry and movement of all extremities. Given tactile stimulation with drying and bulb suction of naso/oropharynx.  No dysmorphic featuers.   Shown to parents then carried to Transitional Nursery. Care to assigned pediatrician.  Dagoberto Ligas MD [facility]

## 2011-01-17 NOTE — Transfer of Care (Signed)
Immediate Anesthesia Transfer of Care Note  Patient: Nicole Humphrey  Procedure(s) Performed:  CESAREAN SECTION WITH BILATERAL TUBAL LIGATION; SCAR REVISION  Patient Location: PACU  Anesthesia Type: Spinal  Level of Consciousness: awake, alert  and oriented  Airway & Oxygen Therapy: Patient Spontanous Breathing  Post-op Assessment: Report given to PACU RN and Post -op Vital signs reviewed and stable  Post vital signs: Reviewed and stable  Complications: No apparent anesthesia complications

## 2011-01-17 NOTE — Anesthesia Preprocedure Evaluation (Signed)
Anesthesia Evaluation  Name, MR# and DOB Patient awake  General Assessment Comment  Reviewed: Allergy & Precautions, H&P , Patient's Chart, lab work & pertinent test results  Airway Mallampati: II TM Distance: >3 FB Neck ROM: full    Dental No notable dental hx.    Pulmonary  clear to auscultation  Pulmonary exam normal       Cardiovascular Exercise Tolerance: Good regular Normal    Neuro/Psych    GI/Hepatic   Endo/Other    Renal/GU      Musculoskeletal   Abdominal   Peds  Hematology   Anesthesia Other Findings   Reproductive/Obstetrics                           Anesthesia Physical Anesthesia Plan  ASA: II  Anesthesia Plan: Spinal   Post-op Pain Management:    Induction:   Airway Management Planned:   Additional Equipment:   Intra-op Plan:   Post-operative Plan:   Informed Consent: I have reviewed the patients History and Physical, chart, labs and discussed the procedure including the risks, benefits and alternatives for the proposed anesthesia with the patient or authorized representative who has indicated his/her understanding and acceptance.   Dental Advisory Given  Plan Discussed with: CRNA  Anesthesia Plan Comments: ( Platelets okay. Discussed spinal anesthetic, and patient consents to the procedure:  included risk of possible headache,backache, failed block, allergic reaction, and nerve injury. This patient was asked if she had any questions or concerns before the procedure started. )        Anesthesia Quick Evaluation

## 2011-01-17 NOTE — Anesthesia Postprocedure Evaluation (Signed)
Anesthesia Post Note  Patient: Nicole Humphrey  Procedure(s) Performed:  CESAREAN SECTION WITH BILATERAL TUBAL LIGATION; SCAR REVISION  Anesthesia type: Spinal  Patient location: PACU  Post pain: Pain level controlled  Post assessment: Post-op Vital signs reviewed  Last Vitals:  Filed Vitals:   01/17/11 1215  BP: 108/73  Pulse: 71  Temp:   Resp: 22    Post vital signs: Reviewed  Level of consciousness: awake  Complications: No apparent anesthesia complications

## 2011-01-17 NOTE — H&P (Addendum)
Nicole Humphrey is a 35 y.o.MB female presenting at 39.1 for scheduled repeat cesarean section and bilateral tubal ligation.  Denies LOF, VB, PIH or UTI s/s.  GFM.  Did receive flu shot during pregnancy.  Husband at Bedside.  Has 12y.o. & 4 y.o. Daughters, and expecting her third.  Followed by Dr. Su Hilt at [facility].   Maternal Medical History:  Fetal activity: Perceived fetal activity is normal.   Last perceived fetal movement was within the past hour.    Prenatal complications: 1.  AMA 2.  Previous c/s x2 3.  Current umbilical and epigastric hernias 4.  H/o abnl pap 5.  Increased DSR on Quad screen; nml amniocentesis 09/07/10:  52 XX 6.  H/o Bilateral EIF on u/s this pregnancy     OB History    Grav Para Term Preterm Abortions TAB SAB Ect Mult Living   3 2 2       2      Past Medical History  Diagnosis Date  . Hernia   . No pertinent past medical history    Past Surgical History  Procedure Date  . Cesarean section     x 2   Family History: family history is not on file. Social History:  reports that she has never smoked. She does not have any smokeless tobacco history on file. She reports that she does not drink alcohol or use illicit drugs.  Review of Systems  Constitutional: Negative.   Respiratory: Negative.   Cardiovascular: Negative.   Gastrointestinal: Negative.   Genitourinary: Negative.   Musculoskeletal: Negative.   Skin: Negative.      Blood pressure 113/78, pulse 74, temperature 98.1 F (36.7 C), temperature source Oral, resp. rate 18, last menstrual period 04/18/2010, SpO2 100.00%. Maternal Exam:  Uterine Assessment: Contraction frequency is rare.   Abdomen: Patient reports no abdominal tenderness. Surgical scars: low transverse.   Introitus: not evaluated.   Cervix: not evaluated.   Fetal Exam Fetal Monitor Review: Mode: hand-held doppler probe.       Physical Exam  Constitutional: She is oriented to person, place, and time. She appears  well-developed and well-nourished. No distress.  Cardiovascular: Normal rate and regular rhythm.   Respiratory: Effort normal and breath sounds normal.  GI: Soft. Bowel sounds are normal.  Genitourinary:       deferred  Musculoskeletal: She exhibits no edema and no tenderness.  Neurological: She is alert and oriented to person, place, and time. She has normal reflexes.  Skin: Skin is warm and dry. She is not diaphoretic.  Psychiatric: She has a normal mood and affect. Her behavior is normal. Judgment and thought content normal.    Prenatal labs: ABO, Rh: --/--/A POS (10/09 1610) Antibody: PENDING (10/09 0810) Rubella: Immune (05/07 0000) RPR:   NR HBsAg: Negative (05/07 0000)  HIV: Non-reactive (05/07 0000)  GBS:    Quad screen abnl Amniocentesis: 46XX  Assessment/Plan: 1.  IUP at 39.1 2.  Previous c/s x2 with plan for repeat & BTL 3.  Present umbilical & epigastric hernias--plan for repair PP  1.  Admit to [facility] with Dr. Su Hilt as attending 2.  Routine preop orders 3.  C/s rec'd secondary to h/o c/s x2; R/B/A rev'd including but not limited to risk of bleeding, infection, damage to surrounding organs, and anesthesia complications; pt verbalized understanding and agreeable to proceed with c/s and btl  STEELMAN,CANDICE H 01/17/2011, 9:07 AM   Agree with above - AYR

## 2011-01-18 ENCOUNTER — Encounter (HOSPITAL_COMMUNITY): Payer: Self-pay | Admitting: Obstetrics and Gynecology

## 2011-01-18 LAB — CBC
HCT: 28.8 % — ABNORMAL LOW (ref 36.0–46.0)
Hemoglobin: 9.9 g/dL — ABNORMAL LOW (ref 12.0–15.0)
RBC: 3.43 MIL/uL — ABNORMAL LOW (ref 3.87–5.11)
WBC: 15 10*3/uL — ABNORMAL HIGH (ref 4.0–10.5)

## 2011-01-18 NOTE — Progress Notes (Addendum)
Subjective: Postpartum Day 1: Cesarean Delivery repeat Patient reports increased cramping, feels bloated.  No flatus yet.  Tolerated regular diet last night, no nausea or vomiting.  Voiding without difficulty.  Hasn't ambulated this am.  Breast feeding.  Objective: Vital signs in last 24 hours: Temp:  [97.4 F (36.3 C)-99 F (37.2 C)] 98.1 F (36.7 C) (10/10 0850) Pulse Rate:  [60-89] 89  (10/10 0850) Resp:  [16-27] 18  (10/10 0850) BP: (93-117)/(51-85) 103/63 mmHg (10/10 0850) SpO2:  [96 %-100 %] 99 % (10/10 0850) Weight:  [73.936 kg (163 lb)] 163 lb (73.936 kg) (10/09 1500) Orthostatics stable  Physical Exam:  General: alert Lochia: appropriate Uterine Fundus: firm Incision: healing well DVT Evaluation: No evidence of DVT seen on physical exam.  No edema Negative Homan's sign. Abdomen:  Tympanic distension in upper abdomen, with hypoactive bowel sounds (present, but diminished).    Results for orders placed during the hospital encounter of 01/17/11 (from the past 24 hour(s))  CBC     Status: Abnormal   Collection Time   01/18/11  5:00 AM      Component Value Range   WBC 15.0 (*) 4.0 - 10.5 (K/uL)   RBC 3.43 (*) 3.87 - 5.11 (MIL/uL)   Hemoglobin 9.9 (*) 12.0 - 15.0 (g/dL)   HCT 16.1 (*) 09.6 - 46.0 (%)   MCV 84.0  78.0 - 100.0 (fL)   MCH 28.9  26.0 - 34.0 (pg)   MCHC 34.4  30.0 - 36.0 (g/dL)   RDW 04.5  40.9 - 81.1 (%)   Platelets 171  150 - 400 (K/uL)  Pre-op Hgb 11.2   Assessment/Plan: Status post Cesarean section.  Gaseous distention, no evidence ileus at present Ambulate, anti-gas meds. If not improved by this afternoon, recommend Dulcolax suppository.  Trevion Hoben 01/18/2011, 9:03 AM

## 2011-01-19 MED ORDER — FLEET ENEMA 7-19 GM/118ML RE ENEM: 1.0000 | ENEMA | Freq: Once | RECTAL | Status: DC

## 2011-01-19 MED ORDER — METOCLOPRAMIDE HCL 10 MG PO TABS
10.0000 mg | ORAL_TABLET | Freq: Four times a day (QID) | ORAL | Status: DC
  Administered 2011-01-19 – 2011-01-20 (×5): 10 mg via ORAL
  Filled 2011-01-19 (×5): qty 1

## 2011-01-19 MED ORDER — BISACODYL 5 MG PO TBEC
10.0000 mg | DELAYED_RELEASE_TABLET | Freq: Every day | ORAL | Status: DC | PRN
  Administered 2011-01-19: 10 mg via ORAL
  Filled 2011-01-19: qty 2

## 2011-01-19 MED ORDER — BISACODYL 10 MG RE SUPP
10.0000 mg | Freq: Every day | RECTAL | Status: DC | PRN
  Administered 2011-01-19: 10 mg via RECTAL
  Filled 2011-01-19: qty 1

## 2011-01-19 NOTE — Progress Notes (Addendum)
Subjective: Postpartum Day 2: Cesarean Delivery Patient reports still feeling very bloated--denies nausea or vomiting.  Reports passing flatus twice, but no BM despite Dulcolax suppository yesterday.  Breastfeeding.  Hasn't ambulated much due to pain.  No Percocet since 1 am--is trying to avoid that for help with gas.  Tolerating regular diet.  Objective: Vital signs in last 24 hours: Temp:  [98 F (36.7 C)-98.6 F (37 C)] 98.6 F (37 C) (10/10 2135) Pulse Rate:  [79-81] 81  (10/10 2135) Resp:  [18] 18  (10/10 2135) BP: (102-105)/(65) 102/65 mmHg (10/10 2135)  Physical Exam:  General: Appears uncomfortable with colicky gas pain. Lochia: appropriate Uterine Fundus: firm Incision: Dsg CDI Abdomen--tympanic with distension.  Very faint bowel sounds. DVT Evaluation: No evidence of DVT seen on physical exam. Negative Homan's sign.   Basename 01/18/11 0500  HGB 9.9*  HCT 28.8*    Assessment/Plan: Status post Cesarean section.  Abdominal distension, no signs ileus at present Consulted with Dr. Normand Sloop. Reglan 10 mg po q 6 hour x 24 hours Will order Fleets' enema Encourage ambulation. Will CTO at present--will defer flat plate of abdomen since no N&V. Encourage shower today.   Jadae Steinke 01/19/2011, 10:15 AM

## 2011-01-19 NOTE — Progress Notes (Signed)
      Subjective: Feeling much better after Fleets enema, initiation of Reglan, and ambulation.  Still feels bloated, but in no acute pain.  Denies nausea and vomiting.  Objective: Vital signs in last 24 hours: Filed Vitals:   01/18/11 0850 01/18/11 1400 01/18/11 2135 01/19/11 1407  BP: 103/63 105/65 102/65 100/56  Pulse: 89 79 81 87  Temp: 98.1 F (36.7 C) 98 F (36.7 C) 98.6 F (37 C) 98.5 F (36.9 C)  TempSrc:  Oral Oral Oral  Resp: 18 18 18 17   Weight:      SpO2: 99%        Physical Exam:  Abdomen--Still tympanic with gas, but bowel sounds are more normoactive now. Fundus firm Lochia small Incision CDI Ext WNL  Assessment/Plan: Improved digestive function, decreased gaseous distension. Will CTO. Anticipate d/c tomorrow.  Nigel Bridgeman 01/19/2011, 3:39 PM

## 2011-01-20 DIAGNOSIS — Z98891 History of uterine scar from previous surgery: Secondary | ICD-10-CM

## 2011-01-20 DIAGNOSIS — R140 Abdominal distension (gaseous): Secondary | ICD-10-CM

## 2011-01-20 MED ORDER — OXYCODONE-ACETAMINOPHEN 5-325 MG PO TABS: 1.0000 | ORAL_TABLET | ORAL | Status: AC | PRN

## 2011-01-20 MED ORDER — IBUPROFEN 600 MG PO TABS: 600.0000 mg | ORAL_TABLET | Freq: Four times a day (QID) | ORAL | Status: AC | PRN

## 2011-01-20 MED ORDER — POLYETHYLENE GLYCOL 3350 17 G PO PACK: 17.0000 g | PACK | Freq: Every day | ORAL | Status: AC

## 2011-01-20 NOTE — Discharge Summary (Signed)
Obstetric Discharge Summary Reason for Admission: cesarean section, previous C/S x2, desired BTL Prenatal Procedures: ultrasound Intrapartum Procedures: cesarean: low cervical, transverse with BTL Postpartum Procedures: none Complications-Operative and Postpartum: Gaseous distension without ileus Hemoglobin  Date Value Range Status  01/18/2011 9.9* 12.0-15.0 (g/dL) Final     HCT  Date Value Range Status  01/18/2011 28.8* 36.0-46.0 (%) Final   Hospital Course:  Admitted on 01/17/11 for repeat LTCS and BTL by Dr. Su Hilt, without intraoperative complications.  Patient was breastfeeding.  She did have issues with postoperative gaseous distension, which were treated with Reglan for 24 hours, Dulcolax suppository, Fleets enema, and ambulation.  By postop day 3, patient was much improved, with normoactive bowel sounds and was passing flatus.  Her incision was CDI, and other physical findings were WNL.  She was using Motrin for pain to avoid further constipation issues, and was discharged home with Motrin, Percocet for limited use, and Miralax for daily use prn.    Discharge Diagnoses: Term Pregnancy-delivered                                         Repeat Cesarean                                          Tubal ligation  Discharge Information: Date: 01/20/2011 Activity: Per [facility] handout Diet: routine Medications: Ibuprofen, Percocet and Miralax Condition: stable Instructions: refer to practice specific booklet Discharge to: home Follow-up Information    [facility] up [facility] in 6 weeks. (As scheduled or as needed)          Newborn Data: Live born female  Birth Weight: 5 lb 15.6 oz (2710 g) APGAR: 9, 9  Home with mother.  LATHAM, VICKI 01/20/2011, 8:04 AM  Agree with Above - AYR

## 2011-01-20 NOTE — Progress Notes (Addendum)
  Subjective: Feeling much better this am, with resolving gaseous distension.  Passing flatus now.  Using Motrin for pain (very limited use of Percocet in last 24 hours).  Ready for d/c.  Denies nausea or vomiting.  Postpartum Day 3: Cesarean Delivery with BTL    Objective: Vital signs in last 24 hours: Temp:  [97.5 F (36.4 C)-98.5 F (36.9 C)] 97.5 F (36.4 C) (10/12 0455) Pulse Rate:  [82-88] 82  (10/12 0455) Resp:  [17-18] 18  (10/12 0455) BP: (100-119)/(56-76) 119/76 mmHg (10/12 0455)  Physical Exam:  General: alert Lochia: appropriate Uterine Fundus: firm Incision: healing well DVT Evaluation: No evidence of DVT seen on physical exam. Negative Homan's sign. Abdomen:  Still tympanic with gas, but normoactive bowel sounds in all quadrants.   Basename 01/18/11 0500  HGB 9.9*  HCT 28.8*    Assessment/Plan: Status post Cesarean section. Doing well postoperatively.  Discharge home with standard precautions and return to clinic in 4-6 weeks. Rx Motrin, Percocet (for limited use), Miralax daily initially, then prn  LATHAM, VICKI 01/20/2011, 8:18 AM    Agree with Above - AYR

## 2011-04-13 ENCOUNTER — Encounter (INDEPENDENT_AMBULATORY_CARE_PROVIDER_SITE_OTHER): Payer: Self-pay | Admitting: General Surgery

## 2011-04-13 ENCOUNTER — Ambulatory Visit (INDEPENDENT_AMBULATORY_CARE_PROVIDER_SITE_OTHER): Payer: 59 | Admitting: General Surgery

## 2011-04-13 VITALS — BP 100/60 | HR 60 | Temp 97.8°F | Resp 16 | Ht 64.0 in | Wt 138.8 lb

## 2011-04-13 DIAGNOSIS — K429 Umbilical hernia without obstruction or gangrene: Secondary | ICD-10-CM

## 2011-04-13 DIAGNOSIS — K439 Ventral hernia without obstruction or gangrene: Secondary | ICD-10-CM

## 2011-04-13 NOTE — Progress Notes (Signed)
Patient ID: Nicole Humphrey, female   DOB: [birth year], 36 y.o.   MRN: [id number]  Chief Complaint  Patient presents with  . Umbilical Hernia    post partum    HPI Nicole Humphrey is a 36 y.o. female.  This patient returns requesting elective repair of her abdominal  wall hernias.  She was last evaluated on November 21, 2010 and she was found to have a small umbilical hernia and a larger epigastric hernia. On October 9 she underwent cesarean section and delivery of a healthy term infant. She has recovered from that surgery and the child is doing well. She says the hernia continues to bulge and hurt and she was to have these repaired at this time. HPI  Past Medical History  Diagnosis Date  . Hernia   . No pertinent past medical history     Past Surgical History  Procedure Date  . Cesarean section     x 2  . Scar revision 01/17/2011    Procedure: SCAR REVISION;  Surgeon: Purcell Nails, MD;  Location: [facility];  Service: Gynecology;  Laterality: N/A;    Family History  Problem Relation Age of Onset  . Heart disease Father     Social History History  Substance Use Topics  . Smoking status: Former Smoker    Quit date: 04/13/2007  . Smokeless tobacco: Not on file  . Alcohol Use: No    No Known Allergies  No current outpatient prescriptions on file.    Review of Systems Review of Systems  Constitutional: Negative for fever, chills and unexpected weight change.  HENT: Negative for hearing loss, congestion, sore throat, trouble swallowing and voice change.   Eyes: Negative for visual disturbance.  Respiratory: Negative for cough and wheezing.   Cardiovascular: Negative for chest pain, palpitations and leg swelling.  Gastrointestinal: Positive for abdominal pain. Negative for nausea, vomiting, diarrhea, constipation, blood in stool, abdominal distention and anal bleeding.  Genitourinary: Negative for hematuria, vaginal bleeding and difficulty urinating.    Musculoskeletal: Negative for arthralgias.  Skin: Negative for rash and wound.  Neurological: Negative for seizures, syncope and headaches.  Hematological: Negative for adenopathy. Does not bruise/bleed easily.  Psychiatric/Behavioral: Negative for confusion.    Blood pressure 100/60, pulse 60, temperature 97.8 F (36.6 C), resp. rate 16, height 5\' 4"  (1.626 m), weight 138 lb 12.8 oz (62.959 kg).  Physical Exam Physical Exam  Constitutional: She is oriented to person, place, and time. She appears well-developed and well-nourished. No distress.  HENT:  Head: Normocephalic and atraumatic.  Nose: Nose normal.  Mouth/Throat: No oropharyngeal exudate.  Eyes: Conjunctivae and EOM are normal. Pupils are equal, round, and reactive to light. Left eye exhibits no discharge. No scleral icterus.  Neck: Neck supple. No JVD present. No tracheal deviation present. No thyromegaly present.  Cardiovascular: Normal rate, regular rhythm, normal heart sounds and intact distal pulses.   No murmur heard. Pulmonary/Chest: Effort normal and breath sounds normal. No respiratory distress. She has no wheezes. She has no rales. She exhibits no tenderness.  Abdominal: Soft. Bowel sounds are normal. She exhibits no distension and no mass. There is tenderness. There is no rebound and no guarding.    Musculoskeletal: She exhibits no edema and no tenderness.  Lymphadenopathy:    She has no cervical adenopathy.  Neurological: She is alert and oriented to person, place, and time. She exhibits normal muscle tone. Coordination normal.  Skin: Skin is warm. No rash noted. She is not diaphoretic. No  erythema. No pallor.  Psychiatric: She has a normal mood and affect. Her behavior is normal. Judgment and thought content normal.    Data Reviewed I have reviewed my old records any recent hospital records from her term delivery.  Assessment    Symptomatic umbilical hernia and epigastric hernia.  Recent cesarean  section, October 9, without complications.    Plan    We discussed the anatomy of her hernias, and the pathophysiology of her pain. We discussed the natural history of enlargement. We discussed the open technique and laparoscopic technique. She is in favor of a laparoscopic approach to the hernia, if possible.  She'll be scheduled for laparoscopic repair of her umbilical and epigastric hernias with a single piece of mesh in the near future.  I discussed the indications and details of surgery with her. Risks and palpitations have been outlined, including but not limited to bleeding, infection, conversion to open laparotomy technique,  recurrence of the hernia, injury to the intestine with major laparotomy for repair, cardiopulmonary and thromboembolic problems. She understands these issues. All questions are answered. She agrees with the plan.       Miken Stecher M 04/13/2011, 8:42 AM

## 2011-04-13 NOTE — Patient Instructions (Signed)
You will be scheduled for a laparoscopic repair of your umbilical hernia and epigastric hernia.

## 2011-06-21 ENCOUNTER — Encounter (HOSPITAL_COMMUNITY): Payer: Self-pay | Admitting: Pharmacy Technician

## 2011-06-26 ENCOUNTER — Encounter (HOSPITAL_COMMUNITY): Payer: Self-pay

## 2011-06-26 ENCOUNTER — Encounter (HOSPITAL_COMMUNITY)
Admission: RE | Admit: 2011-06-26 | Discharge: 2011-06-26 | Disposition: A | Discharge status: Home / Self Care | Payer: 59 | Source: Ambulatory Visit | Attending: General Surgery | Admitting: General Surgery

## 2011-06-26 HISTORY — DX: Anemia, unspecified: D64.9

## 2011-06-26 HISTORY — DX: Rash and other nonspecific skin eruption: R21

## 2011-06-26 LAB — CBC
HCT: 37.1 % (ref 36.0–46.0)
MCV: 84.1 fL (ref 78.0–100.0)
RBC: 4.41 MIL/uL (ref 3.87–5.11)
WBC: 3.6 10*3/uL — ABNORMAL LOW (ref 4.0–10.5)

## 2011-06-26 NOTE — Patient Instructions (Signed)
20 Nicole Humphrey  06/26/2011  Your procedure is scheduled on: 07-04-11  Report to [facility] at  0530 AM.  Call this number if you have problems the morning of surgery: [phone]   Remember:   Do not eat food or drink liquids:After Midnight.  .  Take these medicines the morning of surgery with A SIP OF WATER:no meds to take   Do not wear jewelry or make up.  Do not wear lotions, powders, or perfumes.Do not wear deodorant.    Do not bring valuables to the hospital.  Contacts, dentures or bridgework may not be worn into surgery.  Leave suitcase in the car. After surgery it may be brought to your room.  For patients admitted to the hospital, checkout time is 11:00 AM the day of discharge.     Special Instructions: [employer] forces training and education officer Wash: 1/2 bottle night before surgery and 1/2 bottle morning of surgery.neck down avoid private area, no shaving 2 days before showers   Please read over the following fact sheets that you were given: MRSA Information  Cain Sieve [facility] pre op nurse phone number [phone], call if needed

## 2011-06-26 NOTE — Pre-Procedure Instructions (Addendum)
Pt with small red rash left upper arm and left forehead, to see dr noel 06-29-11 and to call and let Zamya Culhane rn know what dr noel says concerning rash

## 2011-07-03 NOTE — H&P (Signed)
Nicole Humphrey     MRN: [id number]   Description: 36 year old female  Provider: Ernestene Mention, MD  Department: [facility]        Diagnoses     Epigastric hernia   - Primary    553.29    Umbilical hernia     553.1        Vitals       BP Pulse Temp Resp Ht Wt    100/60  60  97.8 F (36.6 C)  16  5\' 4"  (1.626 m)  138 lb 12.8 oz (62.959 kg)     BMI - 23.82 kg/m2               History and Physical    Ernestene Mention, MD  Patient ID: Nicole Humphrey, female   DOB: [birth year], 36 y.o.   MRN: [id number]    Chief Complaint   Patient presents with   .  Umbilical Hernia       post partum        HPI Nicole Humphrey is a 36 y.o. female.  This patient returns requesting elective repair of her abdominal  wall hernias.   She was last evaluated on November 21, 2010 and she was found to have a small umbilical hernia and a larger epigastric hernia. On October 9 she underwent cesarean section and delivery of a healthy term infant. She has recovered from that surgery and the child is doing well. She says the hernia continues to bulge and hurt and she was to have these repaired at this time. HPI    Past Medical History   Diagnosis  Date   .  Hernia     .  No pertinent past medical history         Past Surgical History   Procedure  Date   .  Cesarean section         x 2   .  Scar revision  01/17/2011       Procedure: SCAR REVISION;  Surgeon: Purcell Nails, MD;  Location: [facility];  Service: Gynecology;  Laterality: N/A;       Family History   Problem  Relation  Age of Onset   .  Heart disease  Father        Social History History   Substance Use Topics   .  Smoking status:  Former Smoker       Quit date:  04/13/2007   .  Smokeless tobacco:  Not on file   .  Alcohol Use:  No      No Known Allergies    No current outpatient prescriptions on file.      Review of Systems   Constitutional: Negative for fever, chills and  unexpected weight change.  HENT: Negative for hearing loss, congestion, sore throat, trouble swallowing and voice change.   Eyes: Negative for visual disturbance.  Respiratory: Negative for cough and wheezing.   Cardiovascular: Negative for chest pain, palpitations and leg swelling.  Gastrointestinal: Positive for abdominal pain. Negative for nausea, vomiting, diarrhea, constipation, blood in stool, abdominal distention and anal bleeding.  Genitourinary: Negative for hematuria, vaginal bleeding and difficulty urinating.  Musculoskeletal: Negative for arthralgias.  Skin: Negative for rash and wound.  Neurological: Negative for seizures, syncope and headaches.  Hematological: Negative for adenopathy. Does not bruise/bleed easily.  Psychiatric/Behavioral: Negative for confusion.    Blood pressure 100/60, pulse 60, temperature 97.8 F (36.6 C), resp. rate  16, height 5\' 4"  (1.626 m), weight 138 lb 12.8 oz (62.959 kg).   Physical Exam  Constitutional: She is oriented to person, place, and time. She appears well-developed and well-nourished. No distress.  HENT:   Head: Normocephalic and atraumatic.   Nose: Nose normal.   Mouth/Throat: No oropharyngeal exudate.  Eyes: Conjunctivae and EOM are normal. Pupils are equal, round, and reactive to light. Left eye exhibits no discharge. No scleral icterus.  Neck: Neck supple. No JVD present. No tracheal deviation present. No thyromegaly present.  Cardiovascular: Normal rate, regular rhythm, normal heart sounds and intact distal pulses.    No murmur heard. Pulmonary/Chest: Effort normal and breath sounds normal. No respiratory distress. She has no wheezes. She has no rales. She exhibits no tenderness.  Abdominal: Soft. Bowel sounds are normal. She exhibits no distension and no mass. There is tenderness. There is no rebound and no guarding.  Smaller umbilical hernia and larger epigastric hernia within 6 cm of each other. Both seemingly  reducible.   Musculoskeletal: She exhibits no edema and no tenderness.  Lymphadenopathy:    She has no cervical adenopathy.  Neurological: She is alert and oriented to person, place, and time. She exhibits normal muscle tone. Coordination normal.  Skin: Skin is warm. No rash noted. She is not diaphoretic. No erythema. No pallor.  Psychiatric: She has a normal mood and affect. Her behavior is normal. Judgment and thought content normal.    Data Reviewed I have reviewed my old records any recent hospital records from her term delivery.   Assessment Symptomatic umbilical hernia and epigastric hernia.  Recent cesarean section, October 9, without complications.   Plan   We discussed the anatomy of her hernias, and the pathophysiology of her pain. We discussed the natural history of enlargement. We discussed the open technique and laparoscopic technique. She is in favor of a laparoscopic approach to the hernia, if possible.   She'll be scheduled for laparoscopic repair of her umbilical and epigastric hernias with a single piece of mesh in the near future. I discussed the indications and details of surgery with her. Risks and palpitations have been outlined, including but not limited to bleeding, infection, conversion to open laparotomy technique,  recurrence of the hernia, injury to the intestine with major laparotomy for repair, cardiopulmonary and thromboembolic problems. She understands these issues. All questions are answered. She agrees with the plan.       Angelia Mould. Derrell Lolling, M.D., [facility] General and Minimally invasive Surgery Breast and Colorectal Surgery Office:   [phone] Pager:   [phone]

## 2011-07-04 ENCOUNTER — Inpatient Hospital Stay (HOSPITAL_COMMUNITY)
Admission: AD | Admit: 2011-07-04 | Discharge: 2011-07-15 | DRG: 330 | Disposition: A | Discharge status: Home / Self Care | Payer: 59 | Source: Ambulatory Visit | Attending: General Surgery | Admitting: General Surgery

## 2011-07-04 ENCOUNTER — Encounter (HOSPITAL_COMMUNITY): Payer: Self-pay | Admitting: *Deleted

## 2011-07-04 ENCOUNTER — Encounter (HOSPITAL_COMMUNITY)
Admission: AD | Disposition: A | Discharge status: Home / Self Care | Payer: Self-pay | Source: Ambulatory Visit | Attending: General Surgery

## 2011-07-04 ENCOUNTER — Ambulatory Visit (HOSPITAL_COMMUNITY): Payer: 59 | Admitting: *Deleted

## 2011-07-04 DIAGNOSIS — K439 Ventral hernia without obstruction or gangrene: Secondary | ICD-10-CM

## 2011-07-04 DIAGNOSIS — K929 Disease of digestive system, unspecified: Secondary | ICD-10-CM | POA: Diagnosis not present

## 2011-07-04 DIAGNOSIS — K560 Paralytic ileus: Secondary | ICD-10-CM | POA: Diagnosis not present

## 2011-07-04 DIAGNOSIS — Y838 Other surgical procedures as the cause of abnormal reaction of the patient, or of later complication, without mention of misadventure at the time of the procedure: Secondary | ICD-10-CM | POA: Diagnosis not present

## 2011-07-04 DIAGNOSIS — IMO0002 Reserved for concepts with insufficient information to code with codable children: Secondary | ICD-10-CM | POA: Diagnosis not present

## 2011-07-04 DIAGNOSIS — S36409A Unspecified injury of unspecified part of small intestine, initial encounter: Secondary | ICD-10-CM

## 2011-07-04 DIAGNOSIS — G8918 Other acute postprocedural pain: Secondary | ICD-10-CM | POA: Diagnosis not present

## 2011-07-04 DIAGNOSIS — F411 Generalized anxiety disorder: Secondary | ICD-10-CM | POA: Diagnosis not present

## 2011-07-04 HISTORY — PX: VENTRAL HERNIA REPAIR: SHX424

## 2011-07-04 LAB — CBC
Hemoglobin: 13.4 g/dL (ref 12.0–15.0)
MCV: 85.1 fL (ref 78.0–100.0)
Platelets: 248 10*3/uL (ref 150–400)
RBC: 4.62 MIL/uL (ref 3.87–5.11)
WBC: 10.4 10*3/uL (ref 4.0–10.5)

## 2011-07-04 SURGERY — REPAIR, HERNIA, VENTRAL, LAPAROSCOPIC
Anesthesia: General | Site: Abdomen | Wound class: Clean

## 2011-07-04 MED ORDER — CEFAZOLIN SODIUM 1-5 GM-% IV SOLN
INTRAVENOUS | Status: AC
  Filled 2011-07-04: qty 50

## 2011-07-04 MED ORDER — FIBRIN SEALANT COMPONENT 5 ML EX KIT
PACK | CUTANEOUS | Status: AC
  Filled 2011-07-04: qty 2

## 2011-07-04 MED ORDER — ONDANSETRON HCL 4 MG/2ML IJ SOLN
INTRAMUSCULAR | Status: DC | PRN
  Administered 2011-07-04: 4 mg via INTRAVENOUS

## 2011-07-04 MED ORDER — NEOSTIGMINE METHYLSULFATE 1 MG/ML IJ SOLN
INTRAMUSCULAR | Status: DC | PRN
  Administered 2011-07-04: 4 mg via INTRAVENOUS

## 2011-07-04 MED ORDER — PROPOFOL 10 MG/ML IV BOLUS
INTRAVENOUS | Status: DC | PRN
  Administered 2011-07-04: 120 mg via INTRAVENOUS

## 2011-07-04 MED ORDER — PROMETHAZINE HCL 25 MG/ML IJ SOLN: 6.2500 mg | INTRAMUSCULAR | Status: DC | PRN

## 2011-07-04 MED ORDER — LACTATED RINGERS IV SOLN
INTRAVENOUS | Status: DC | PRN
  Administered 2011-07-04 (×3): via INTRAVENOUS

## 2011-07-04 MED ORDER — SODIUM CHLORIDE 0.9 % IV SOLN
INTRAVENOUS | Status: AC
  Filled 2011-07-04: qty 1

## 2011-07-04 MED ORDER — SODIUM CHLORIDE 0.9 % IV SOLN
1.0000 g | INTRAVENOUS | Status: DC | PRN
  Administered 2011-07-04: 1 g via INTRAVENOUS

## 2011-07-04 MED ORDER — CEFAZOLIN SODIUM 1-5 GM-% IV SOLN
1.0000 g | INTRAVENOUS | Status: AC
  Administered 2011-07-04: 1 g via INTRAVENOUS

## 2011-07-04 MED ORDER — MORPHINE SULFATE 2 MG/ML IJ SOLN
2.0000 mg | INTRAMUSCULAR | Status: DC | PRN
  Administered 2011-07-04 – 2011-07-05 (×7): 2 mg via INTRAVENOUS
  Filled 2011-07-04 (×7): qty 1

## 2011-07-04 MED ORDER — HEPARIN SODIUM (PORCINE) 5000 UNIT/ML IJ SOLN
5000.0000 [IU] | Freq: Three times a day (TID) | INTRAMUSCULAR | Status: DC
  Administered 2011-07-05 – 2011-07-10 (×13): 5000 [IU] via SUBCUTANEOUS
  Filled 2011-07-04 (×25): qty 1

## 2011-07-04 MED ORDER — DIPHENHYDRAMINE HCL 50 MG/ML IJ SOLN
INTRAMUSCULAR | Status: AC
  Administered 2011-07-04: 12.5 mg
  Filled 2011-07-04: qty 1

## 2011-07-04 MED ORDER — ROCURONIUM BROMIDE 100 MG/10ML IV SOLN
INTRAVENOUS | Status: DC | PRN
  Administered 2011-07-04: 10 mg via INTRAVENOUS
  Administered 2011-07-04: 20 mg via INTRAVENOUS
  Administered 2011-07-04: 10 mg via INTRAVENOUS
  Administered 2011-07-04: 30 mg via INTRAVENOUS
  Administered 2011-07-04 (×2): 10 mg via INTRAVENOUS

## 2011-07-04 MED ORDER — GLYCOPYRROLATE 0.2 MG/ML IJ SOLN
INTRAMUSCULAR | Status: DC | PRN
  Administered 2011-07-04: .7 mg via INTRAVENOUS

## 2011-07-04 MED ORDER — POTASSIUM CHLORIDE IN NACL 20-0.9 MEQ/L-% IV SOLN
INTRAVENOUS | Status: DC
  Administered 2011-07-04: 15:00:00 via INTRAVENOUS
  Administered 2011-07-04 – 2011-07-05 (×2): 1000 mL via INTRAVENOUS
  Administered 2011-07-05 – 2011-07-07 (×6): via INTRAVENOUS
  Administered 2011-07-08: 100 mL/h via INTRAVENOUS
  Administered 2011-07-08 – 2011-07-10 (×3): via INTRAVENOUS
  Administered 2011-07-10: 100 mL/h via INTRAVENOUS
  Administered 2011-07-11 – 2011-07-13 (×4): via INTRAVENOUS
  Filled 2011-07-04 (×23): qty 1000

## 2011-07-04 MED ORDER — HYDROMORPHONE HCL PF 1 MG/ML IJ SOLN
INTRAMUSCULAR | Status: AC
  Filled 2011-07-04: qty 1

## 2011-07-04 MED ORDER — ONDANSETRON HCL 4 MG PO TABS: 4.0000 mg | ORAL_TABLET | Freq: Four times a day (QID) | ORAL | Status: DC | PRN

## 2011-07-04 MED ORDER — TISSEEL VH 10 ML EX KIT
PACK | CUTANEOUS | Status: DC | PRN
  Administered 2011-07-04: 10 mL

## 2011-07-04 MED ORDER — DIPHENHYDRAMINE HCL 50 MG/ML IJ SOLN
12.5000 mg | Freq: Once | INTRAMUSCULAR | Status: AC
  Administered 2011-07-04: 12.5 mg via INTRAVENOUS

## 2011-07-04 MED ORDER — LACTATED RINGERS IV SOLN: INTRAVENOUS | Status: DC

## 2011-07-04 MED ORDER — ONDANSETRON HCL 4 MG/2ML IJ SOLN: 4.0000 mg | Freq: Four times a day (QID) | INTRAMUSCULAR | Status: DC | PRN

## 2011-07-04 MED ORDER — DIPHENHYDRAMINE HCL 50 MG/ML IJ SOLN
25.0000 mg | Freq: Four times a day (QID) | INTRAMUSCULAR | Status: DC | PRN
  Administered 2011-07-04: 25 mg via INTRAVENOUS

## 2011-07-04 MED ORDER — DEXAMETHASONE SODIUM PHOSPHATE 10 MG/ML IJ SOLN
INTRAMUSCULAR | Status: DC | PRN
  Administered 2011-07-04: 10 mg via INTRAVENOUS

## 2011-07-04 MED ORDER — HYDROMORPHONE HCL PF 1 MG/ML IJ SOLN
INTRAMUSCULAR | Status: DC | PRN
  Administered 2011-07-04: 1 mg via INTRAVENOUS
  Administered 2011-07-04: 0.5 mg via INTRAVENOUS
  Administered 2011-07-04: 1 mg via INTRAVENOUS
  Administered 2011-07-04: 0.5 mg via INTRAVENOUS
  Administered 2011-07-04: 1 mg via INTRAVENOUS

## 2011-07-04 MED ORDER — LIDOCAINE HCL (CARDIAC) 20 MG/ML IV SOLN
INTRAVENOUS | Status: DC | PRN
  Administered 2011-07-04: 50 mg via INTRAVENOUS

## 2011-07-04 MED ORDER — BUPIVACAINE-EPINEPHRINE 0.5% -1:200000 IJ SOLN
INTRAMUSCULAR | Status: DC | PRN
  Administered 2011-07-04: 17 mL

## 2011-07-04 MED ORDER — FENTANYL CITRATE 0.05 MG/ML IJ SOLN
INTRAMUSCULAR | Status: DC | PRN
  Administered 2011-07-04: 50 ug via INTRAVENOUS
  Administered 2011-07-04: 100 ug via INTRAVENOUS
  Administered 2011-07-04 (×2): 50 ug via INTRAVENOUS

## 2011-07-04 MED ORDER — HYDROMORPHONE HCL PF 1 MG/ML IJ SOLN
0.2500 mg | INTRAMUSCULAR | Status: DC | PRN
  Administered 2011-07-04: 0.25 mg via INTRAVENOUS
  Administered 2011-07-04 (×4): 0.5 mg via INTRAVENOUS

## 2011-07-04 MED ORDER — SODIUM CHLORIDE 0.9 % IV SOLN
1.0000 g | INTRAVENOUS | Status: AC
  Administered 2011-07-05: 1 g via INTRAVENOUS
  Filled 2011-07-04: qty 1

## 2011-07-04 MED ORDER — BUPIVACAINE-EPINEPHRINE (PF) 0.5% -1:200000 IJ SOLN
INTRAMUSCULAR | Status: AC
  Filled 2011-07-04: qty 20

## 2011-07-04 SURGICAL SUPPLY — 56 items
ADH SKN CLS APL DERMABOND .7 (GAUZE/BANDAGES/DRESSINGS) ×6
APL SKNCLS STERI-STRIP NONHPOA (GAUZE/BANDAGES/DRESSINGS) ×2
APPLIER CLIP 5 13 M/L LIGAMAX5 (MISCELLANEOUS)
APR CLP MED LRG 5 ANG JAW (MISCELLANEOUS)
BAG SPEC RTRVL LRG 6X4 10 (ENDOMECHANICALS)
BENZOIN TINCTURE PRP APPL 2/3 (GAUZE/BANDAGES/DRESSINGS) ×1 IMPLANT
BINDER ABD UNIV 12 45-62 (WOUND CARE) IMPLANT
BINDER ABDOMINAL 46IN 62IN (WOUND CARE) ×3
CANISTER SUCTION 2500CC (MISCELLANEOUS) ×3 IMPLANT
CANNULA ENDOPATH XCEL 11M (ENDOMECHANICALS) IMPLANT
CLIP APPLIE 5 13 M/L LIGAMAX5 (MISCELLANEOUS) IMPLANT
CLOTH BEACON ORANGE TIMEOUT ST (SAFETY) ×3 IMPLANT
DECANTER SPIKE VIAL GLASS SM (MISCELLANEOUS) ×3 IMPLANT
DERMABOND ADVANCED (GAUZE/BANDAGES/DRESSINGS) ×3
DERMABOND ADVANCED .7 DNX12 (GAUZE/BANDAGES/DRESSINGS) IMPLANT
DEVICE SECURE STRAP 25 ABSORB (INSTRUMENTS) IMPLANT
DEVICE SUTURE ENDOST 10MM (ENDOMECHANICALS) ×1 IMPLANT
DEVICE TROCAR PUNCTURE CLOSURE (ENDOMECHANICALS) ×3 IMPLANT
DISSECTOR BLUNT TIP ENDO 5MM (MISCELLANEOUS) IMPLANT
DRAPE LAPAROSCOPIC ABDOMINAL (DRAPES) ×3 IMPLANT
DUPLOJECT EASY PREP 4ML (MISCELLANEOUS) ×1 IMPLANT
ELECT REM PT RETURN 9FT ADLT (ELECTROSURGICAL) ×3
ELECTRODE REM PT RTRN 9FT ADLT (ELECTROSURGICAL) ×2 IMPLANT
ENDOSTITCH 0 SINGLE 48 (SUTURE) ×1 IMPLANT
GLOVE BIOGEL PI IND STRL 7.0 (GLOVE) ×2 IMPLANT
GLOVE BIOGEL PI INDICATOR 7.0 (GLOVE) ×3
GLOVE EUDERMIC 7 POWDERFREE (GLOVE) ×3 IMPLANT
GOWN STRL NON-REIN LRG LVL3 (GOWN DISPOSABLE) ×4 IMPLANT
GOWN STRL REIN XL XLG (GOWN DISPOSABLE) ×7 IMPLANT
HAND ACTIVATED (MISCELLANEOUS) ×1 IMPLANT
KIT BASIN OR (CUSTOM PROCEDURE TRAY) ×3 IMPLANT
NDL SPNL 22GX3.5 QUINCKE BK (NEEDLE) ×2 IMPLANT
NEEDLE SPNL 22GX3.5 QUINCKE BK (NEEDLE) ×3 IMPLANT
NS IRRIG 1000ML POUR BTL (IV SOLUTION) ×3 IMPLANT
PEN SKIN MARKING BROAD (MISCELLANEOUS) ×3 IMPLANT
PENCIL BUTTON HOLSTER BLD 10FT (ELECTRODE) ×1 IMPLANT
POUCH SPECIMEN RETRIEVAL 10MM (ENDOMECHANICALS) IMPLANT
PUNCH BIOPSY 3 (MISCELLANEOUS) ×1 IMPLANT
SCISSORS LAP 5X35 DISP (ENDOMECHANICALS) ×3 IMPLANT
SET IRRIG TUBING LAPAROSCOPIC (IRRIGATION / IRRIGATOR) ×3 IMPLANT
SOLUTION ANTI FOG 6CC (MISCELLANEOUS) ×3 IMPLANT
STAPLER VISISTAT 35W (STAPLE) ×3 IMPLANT
STRIP CLOSURE SKIN 1/2X4 (GAUZE/BANDAGES/DRESSINGS) ×1 IMPLANT
SUT MNCRL AB 4-0 PS2 18 (SUTURE) ×5 IMPLANT
SUT NOVA 0 T19/GS 22DT (SUTURE) ×5 IMPLANT
SUT NOVA NAB GS-21 1 T12 (SUTURE) ×2 IMPLANT
TACKER 5MM HERNIA 3.5CML NAB (ENDOMECHANICALS) ×3 IMPLANT
TISSUE MATRIX STRATTICE 16X20 (Tissue) ×3 IMPLANT
TISSUE MATRIX STRATTICE 20X16 (Tissue) IMPLANT
TOWEL OR 17X26 10 PK STRL BLUE (TOWEL DISPOSABLE) ×3 IMPLANT
TRAY FOLEY CATH 14FRSI W/METER (CATHETERS) ×3 IMPLANT
TRAY LAP CHOLE (CUSTOM PROCEDURE TRAY) ×3 IMPLANT
TROCAR BLADELESS OPT 5 75 (ENDOMECHANICALS) ×6 IMPLANT
TROCAR XCEL BLUNT TIP 100MML (ENDOMECHANICALS) ×1 IMPLANT
TROCAR XCEL NON-BLD 11X100MML (ENDOMECHANICALS) ×3 IMPLANT
TUBING INSUFFLATION 10FT LAP (TUBING) ×3 IMPLANT

## 2011-07-04 NOTE — Anesthesia Postprocedure Evaluation (Signed)
  Anesthesia Post-op Note  Patient: Nicole Humphrey  Procedure(s) Performed: Procedure(s) (LRB): LAPAROSCOPIC VENTRAL HERNIA (N/A) INSERTION OF MESH (N/A)  Patient Location: PACU  Anesthesia Type: General  Level of Consciousness: awake and alert   Airway and Oxygen Therapy: Patient Spontanous Breathing  Post-op Pain: mild  Post-op Assessment: Post-op Vital signs reviewed, Patient's Cardiovascular Status Stable, Respiratory Function Stable, Patent Airway and No signs of Nausea or vomiting  Post-op Vital Signs: stable  Complications: No apparent anesthesia complications. Mild pruritis

## 2011-07-04 NOTE — Transfer of Care (Signed)
Immediate Anesthesia Transfer of Care Note  Patient: Nicole Humphrey  Procedure(s) Performed: Procedure(s) (LRB): LAPAROSCOPIC VENTRAL HERNIA (N/A) INSERTION OF MESH (N/A)  Patient Location: PACU  Anesthesia Type: General  Level of Consciousness: sedated, patient cooperative and responds to stimulaton  Airway & Oxygen Therapy: Patient Spontanous Breathing and Patient connected to face mask oxgen  Post-op Assessment: Report given to PACU RN and Post -op Vital signs reviewed and stable  Post vital signs: Reviewed and stable  Complications: No apparent anesthesia complications

## 2011-07-04 NOTE — Op Note (Signed)
Patient Name:           Siarah Deleo [facility]   Date of Surgery:        07/04/2011  Pre op Diagnosis:      Multiple ventral hernias  Post op Diagnosis:    same  Procedure:                 Laparoscopic repair of multiple ventral hernias with biologic mesh(Strattice), enterorrhaphy times one  Surgeon:                     Angelia Mould. Derrell Lolling, M.D., FACS  Assistant:                   none                   Operative Indications:   This is a 36 year old female who underwent cesarean section in October of last year and she has done well from that. I have evaluated her both during her pregnancy and postpartum. She has a small hernia at the umbilicus and a larger hernia in the epigastrium in the midline about 5 cm above the umbilicus. These are reducible but painful. She was to have these areas repaired. She is brought the operating room electively.  Operative Findings:         Procedure in Detail:          Following the induction of general endotracheal anesthesia a Foley catheter was inserted. The abdomen was prepped and draped in a sterile fashion. Intravenous antibiotics were given. The surgical time out was performed.  0.5% Marcaine with epinephrine was used as a local infiltration anesthetic.  A 5 mm optical trocar was placed in the left subcostal region. This trocar passed through the abdominal tissues fairly rapidly as the tissues were quite soft. When I inserted the camera I found that there was a little bit of blood around the tip of the trocar and I was under the omentum. Pneumoperitoneum was created. Ultimately I placed two 5 mm trocars in the left lower quadrant and left lateral abdomen and an 11 mm trocar in the right mid abdomen and 5 mm trocar in the right upper quadrant.  I first examined the abdominal cavity and the viscera to see if there was any injury. The omentum was uninjured. It was reflected upward. I examined the greater curvature of the stomach and the transverse colon and  splenic flexure and descending colon. There was no injury, hemorrhage, or hematoma here. There was no retroperitoneal hematoma. I ran the small bowel from the ligament of Treitz all the way down to the right lower quadrant. There was one area of proximal small bowel where there appeared to be a small serosal injury. There was no enteric contamination. I examined this and decompressed the bowel looking on both sides of the bowel. I could not demonstrate any leak. I chose to place a Endo Stitch to imbricate this area and then I placed Tisseel on this area. I ran the colon and small bowel about 3 or 4 times and found no other injury. There was no mesenteric hemorrhage. There was no bleeding in this area at the end of the case. I felt that it was likely that this was the only area where there had been a serosal injury and could not demonstrate a full-thickness injury.  I chose to use biologic mesh because of the serosal injury to the small bowel. I mapped out the  hernias. I took down the falciform ligament with the harmonic scalpel. The 2 hernias in the midline were noted. To get a 4-5 cm overlap I brought a 16 cm x 20 cm piece of strattice  biologic mesh to the operative field. This was trimmed at the corners. I drew a template on the mesh and a template on the abdominal wall. I placed 8 equidistant sutures of #1 Novofil in the mesh. After marking the mesh I soaked it in saline for about 3 minutes. I then inserted the mesh into the abdominal cavity through the right lower quadrant 11 mm trocar site. I positioned the mesh. Using the Endo Close device  the Novofil sutures were pulled up through 8 small stab wounds at the marked sites. After all of these sutures were placed they were individually tied lifting the mesh. This provided excellent coverage of the hernia defects. I was careful to take a 1 cm bite of fascia everywhere. I then used a 5 mm pro- tacker to fire the 5 mm tacks through the stattice mesh. This  worked well and the tacks were through the mesh into the abdominal wall and seated quite well. I did a double crown technique and a few extra tacks to try to get good positioning of the mesh. The mesh had been positioned so that the porous side was up against the abdominal wall and the slick side was against the intestine. The placement of the mesh and the hernia repair was examined and everything looked good. I then examined the small bowel and the colon omentum and stomach one more time. The Tisseel looked good and there was no evidence of any other injury or bleeding anywhere. The pneumoperitoneum was released. The trocars were removed. The fascia in the right lower quadrant at the 12 mm trocar was closed with a 0 Vicryl sutures and with the Endo Close device before the pneumoperitoneum was released. All the skin incisions were closed with subcuticular sutures of 4-0 Monocryl and Dermabond. Velcro binder was placed. The patient was taken to recovery in stable condition. EBL 25 cc. Counts correct. Patient stable to PACU.     Angelia Mould. Derrell Lolling, M.D., FACS General and Minimally Invasive Surgery Breast and Colorectal Surgery  07/04/2011 9:55 AM

## 2011-07-04 NOTE — Anesthesia Preprocedure Evaluation (Signed)
Anesthesia Evaluation  Patient identified by MRN, date of birth, ID band Patient awake    Reviewed: Allergy & Precautions, H&P , NPO status , Patient's Chart, lab work & pertinent test results  Airway Mallampati: II TM Distance: >3 FB Neck ROM: Full    Dental No notable dental hx.    Pulmonary former smoker breath sounds clear to auscultation  Pulmonary exam normal       Cardiovascular negative cardio ROS  Rhythm:Regular Rate:Normal     Neuro/Psych negative neurological ROS  negative psych ROS   GI/Hepatic negative GI ROS, Neg liver ROS,   Endo/Other  negative endocrine ROS  Renal/GU negative Renal ROS  negative genitourinary   Musculoskeletal negative musculoskeletal ROS (+)   Abdominal   Peds negative pediatric ROS (+)  Hematology negative hematology ROS (+)   Anesthesia Other Findings   Reproductive/Obstetrics negative OB ROS                           Anesthesia Physical Anesthesia Plan  ASA: I  Anesthesia Plan: General   Post-op Pain Management:    Induction: Intravenous  Airway Management Planned: Oral ETT  Additional Equipment:   Intra-op Plan:   Post-operative Plan: Extubation in OR  Informed Consent: I have reviewed the patients History and Physical, chart, labs and discussed the procedure including the risks, benefits and alternatives for the proposed anesthesia with the patient or authorized representative who has indicated his/her understanding and acceptance.   Dental advisory given  Plan Discussed with: CRNA  Anesthesia Plan Comments:         Anesthesia Quick Evaluation

## 2011-07-04 NOTE — Progress Notes (Signed)
Patient has voided twice using the bedside commode.  Although she complains of pain, she transfers her wait with minimal assistance and is able to reposition herself in bed.

## 2011-07-04 NOTE — Interval H&P Note (Signed)
History and Physical Interval Note:  07/04/2011 7:06 AM  Nicole Humphrey  has presented today for surgery, with the diagnosis of painful hernias  The goals of treatment and the various methods of treatment have been discussed with the patient and family. After consideration of risks, benefits and other options for treatment, the patient has consented to  Procedure(s) (LRB): LAPAROSCOPIC VENTRAL HERNIA (N/A) INSERTION OF MESH (N/A) as a surgical intervention .  The patients' history has been reviewed today, the patient examined today, no change in status, stable for surgery.  I have reviewed the patients' chart and labs.  Questions were answered to the patient's satisfaction.     Ernestene Mention

## 2011-07-05 LAB — BASIC METABOLIC PANEL
CO2: 28 mEq/L (ref 19–32)
Calcium: 8.7 mg/dL (ref 8.4–10.5)
Creatinine, Ser: 0.57 mg/dL (ref 0.50–1.10)
GFR calc non Af Amer: >90 mL/min (ref >90)
Glucose, Bld: 87 mg/dL (ref 70–99)
Sodium: 137 mEq/L (ref 135–145)

## 2011-07-05 LAB — DIFFERENTIAL
Eosinophils Absolute: 0 10*3/uL (ref 0.0–0.7)
Eosinophils Relative: 0 % (ref 0–5)
Lymphocytes Relative: 20 % (ref 12–46)
Lymphs Abs: 1.8 10*3/uL (ref 0.7–4.0)
Monocytes Relative: 6 % (ref 3–12)
Neutrophils Relative %: 74 % (ref 43–77)

## 2011-07-05 LAB — CBC
MCH: 28.7 pg (ref 26.0–34.0)
MCHC: 33.8 g/dL (ref 30.0–36.0)
MCV: 85 fL (ref 78.0–100.0)
Platelets: 237 10*3/uL (ref 150–400)

## 2011-07-05 LAB — LIPASE, BLOOD: Lipase: 14 U/L (ref 11–59)

## 2011-07-05 MED ORDER — DIPHENHYDRAMINE HCL 50 MG/ML IJ SOLN
12.5000 mg | Freq: Four times a day (QID) | INTRAMUSCULAR | Status: DC | PRN
  Administered 2011-07-05 – 2011-07-11 (×10): 12.5 mg via INTRAVENOUS
  Filled 2011-07-05 (×10): qty 1

## 2011-07-05 MED ORDER — NALOXONE HCL 0.4 MG/ML IJ SOLN: 0.4000 mg | INTRAMUSCULAR | Status: DC | PRN

## 2011-07-05 MED ORDER — DIPHENHYDRAMINE HCL 50 MG/ML IJ SOLN: 12.5000 mg | Freq: Four times a day (QID) | INTRAMUSCULAR | Status: DC | PRN

## 2011-07-05 MED ORDER — ONDANSETRON HCL 4 MG/2ML IJ SOLN: 4.0000 mg | Freq: Four times a day (QID) | INTRAMUSCULAR | Status: DC | PRN

## 2011-07-05 MED ORDER — MORPHINE SULFATE (PF) 1 MG/ML IV SOLN: INTRAVENOUS | Status: DC

## 2011-07-05 MED ORDER — SODIUM CHLORIDE 0.9 % IJ SOLN: 9.0000 mL | INTRAMUSCULAR | Status: DC | PRN

## 2011-07-05 MED ORDER — ONDANSETRON HCL 4 MG/2ML IJ SOLN
4.0000 mg | Freq: Four times a day (QID) | INTRAMUSCULAR | Status: DC | PRN
  Administered 2011-07-06 – 2011-07-13 (×5): 4 mg via INTRAVENOUS
  Filled 2011-07-05 (×5): qty 2

## 2011-07-05 MED ORDER — DIPHENHYDRAMINE HCL 12.5 MG/5ML PO ELIX: 12.5000 mg | ORAL_SOLUTION | Freq: Four times a day (QID) | ORAL | Status: DC | PRN

## 2011-07-05 MED ORDER — MORPHINE SULFATE (PF) 1 MG/ML IV SOLN
INTRAVENOUS | Status: DC
  Administered 2011-07-05 (×2): via INTRAVENOUS
  Administered 2011-07-05: 18 mg via INTRAVENOUS
  Administered 2011-07-05: 6 mg via INTRAVENOUS
  Administered 2011-07-05: 9.71 mg via INTRAVENOUS
  Administered 2011-07-06 (×2): 4.5 mg via INTRAVENOUS
  Filled 2011-07-05 (×3): qty 25

## 2011-07-05 MED ORDER — HYDROCODONE-ACETAMINOPHEN 5-325 MG PO TABS
1.0000 | ORAL_TABLET | ORAL | Status: DC | PRN
  Administered 2011-07-06: 2 via ORAL
  Filled 2011-07-05: qty 2

## 2011-07-05 MED ORDER — DIPHENHYDRAMINE HCL 12.5 MG/5ML PO ELIX
12.5000 mg | ORAL_SOLUTION | Freq: Four times a day (QID) | ORAL | Status: DC | PRN
  Administered 2011-07-13: 12.5 mg via ORAL
  Filled 2011-07-05: qty 5

## 2011-07-05 NOTE — Progress Notes (Addendum)
1 Day Post-Op  Subjective: Alert. Complaints of abdominal pain. Getting out of bed but unable to ambulate. Denies nausea. Denies shortness of breath. Able to void without difficulty.  Afebrile. Heart rate 66. Respirations 20 and unlabored.  Objective: Vital signs in last 24 hours: Temp:  [97 F (36.1 C)-98.8 F (37.1 C)] 98.8 F (37.1 C) (03/27 0542) Pulse Rate:  [59-78] 66  (03/27 0542) Resp:  [6-20] 20  (03/27 0542) BP: (110-147)/(75-94) 112/78 mmHg (03/27 0542) SpO2:  [95 %-100 %] 97 % (03/27 0542) Weight:  [150 lb 5.7 oz (68.2 kg)] 150 lb 5.7 oz (68.2 kg) (03/26 1333)    Intake/Output from previous day: 03/26 0701 - 03/27 0700 In: 3908.3 [I.V.:3858.3; IV Piggyback:50] Out: 825 [Urine:825] Intake/Output this shift: Total I/O In: 1758.3 [I.V.:1758.3] Out: 600 [Urine:600]  General appearance: alert and cooperative and oriented. A little tearful and anxious. Resp: clear to auscultation bilaterally GI: diffusely tender but generally soft and not distended. Hypoactive bowel sounds. Wounds look fine. Exam is suggestive of normal postoperative incisional pain.  Lab Results:  Results for orders placed during the hospital encounter of 07/04/11 (from the past 24 hour(s))  CBC     Status: Normal   Collection Time   07/04/11  1:30 PM      Component Value Range   WBC 10.4  4.0 - 10.5 (K/uL)   RBC 4.62  3.87 - 5.11 (MIL/uL)   Hemoglobin 13.4  12.0 - 15.0 (g/dL)   HCT 86.5  78.4 - 69.6 (%)   MCV 85.1  78.0 - 100.0 (fL)   MCH 29.0  26.0 - 34.0 (pg)   MCHC 34.1  30.0 - 36.0 (g/dL)   RDW 29.5  28.4 - 13.2 (%)   Platelets 248  150 - 400 (K/uL)     Studies/Results: @RISRSLT24 @     .  ceFAZolin (ANCEF) IV  1 g Intravenous 60 min Pre-Op  . diphenhydrAMINE      . diphenhydrAMINE  12.5 mg Intravenous Once  . ertapenem (INVANZ) IV  1 g Intravenous Q24H  . heparin  5,000 Units Subcutaneous Q8H  . HYDROmorphone      . HYDROmorphone      . HYDROmorphone          Assessment/Plan: s/p Procedure(s): LAPAROSCOPIC VENTRAL HERNIA INSERTION OF MESH  POD #1. Stable, postop abdominal pain. I discussed the operative events with her. I told her that there may have been an injury to the small bowel which was repaired. I told her that no other injuries were identified, but that she would need to be observed closely. and that she would have to remain hospitalized.  She understands this.  Will allow clear liquids.  We'll check lab work this morning  Adjust pain medication.  Patient Active Hospital Problem List: No active hospital problems.   LOS: 1 day    Nicole Humphrey M 07/05/2011  . .prob

## 2011-07-06 ENCOUNTER — Inpatient Hospital Stay (HOSPITAL_COMMUNITY): Payer: 59

## 2011-07-06 LAB — COMPREHENSIVE METABOLIC PANEL
BUN: 4 mg/dL — ABNORMAL LOW (ref 6–23)
CO2: 25 mEq/L (ref 19–32)
Calcium: 8.7 mg/dL (ref 8.4–10.5)
GFR calc Af Amer: >90 mL/min (ref >90)
GFR calc non Af Amer: >90 mL/min (ref >90)
Glucose, Bld: 69 mg/dL — ABNORMAL LOW (ref 70–99)
Total Protein: 6.4 g/dL (ref 6.0–8.3)

## 2011-07-06 LAB — CBC
HCT: 34.8 % — ABNORMAL LOW (ref 36.0–46.0)
MCH: 28.6 pg (ref 26.0–34.0)
MCV: 85.9 fL (ref 78.0–100.0)
Platelets: 185 10*3/uL (ref 150–400)
RBC: 4.05 MIL/uL (ref 3.87–5.11)

## 2011-07-06 MED ORDER — MORPHINE SULFATE (PF) 1 MG/ML IV SOLN
INTRAVENOUS | Status: DC
  Administered 2011-07-06: 14:00:00 via INTRAVENOUS
  Administered 2011-07-06: 19.5 mg via INTRAVENOUS
  Filled 2011-07-06: qty 25

## 2011-07-06 MED ORDER — HYDROMORPHONE 0.3 MG/ML IV SOLN: INTRAVENOUS | Status: DC

## 2011-07-06 MED ORDER — DIPHENHYDRAMINE HCL 12.5 MG/5ML PO ELIX: 12.5000 mg | ORAL_SOLUTION | Freq: Four times a day (QID) | ORAL | Status: DC | PRN

## 2011-07-06 MED ORDER — FLEET ENEMA 7-19 GM/118ML RE ENEM
1.0000 | ENEMA | Freq: Once | RECTAL | Status: AC
  Administered 2011-07-07: 1 via RECTAL
  Filled 2011-07-06: qty 1

## 2011-07-06 MED ORDER — DIPHENHYDRAMINE HCL 50 MG/ML IJ SOLN: 12.5000 mg | Freq: Four times a day (QID) | INTRAMUSCULAR | Status: DC | PRN

## 2011-07-06 MED ORDER — IOHEXOL 300 MG/ML  SOLN
100.0000 mL | Freq: Once | INTRAMUSCULAR | Status: AC | PRN
  Administered 2011-07-06: 100 mL via INTRAVENOUS

## 2011-07-06 MED ORDER — ONDANSETRON HCL 4 MG/2ML IJ SOLN: 4.0000 mg | Freq: Four times a day (QID) | INTRAMUSCULAR | Status: DC | PRN

## 2011-07-06 MED ORDER — SIMETHICONE 80 MG PO CHEW
80.0000 mg | CHEWABLE_TABLET | Freq: Four times a day (QID) | ORAL | Status: DC | PRN
  Administered 2011-07-06 – 2011-07-14 (×3): 80 mg via ORAL
  Filled 2011-07-06 (×3): qty 1

## 2011-07-06 MED ORDER — LORAZEPAM 2 MG/ML IJ SOLN
1.0000 mg | Freq: Four times a day (QID) | INTRAMUSCULAR | Status: DC | PRN
  Administered 2011-07-06 – 2011-07-11 (×4): 1 mg via INTRAVENOUS
  Filled 2011-07-06 (×4): qty 1

## 2011-07-06 MED ORDER — NALOXONE HCL 0.4 MG/ML IJ SOLN: 0.4000 mg | INTRAMUSCULAR | Status: DC | PRN

## 2011-07-06 MED ORDER — SODIUM CHLORIDE 0.9 % IJ SOLN: 9.0000 mL | INTRAMUSCULAR | Status: DC | PRN

## 2011-07-06 MED ORDER — ACETAMINOPHEN 10 MG/ML IV SOLN
1000.0000 mg | Freq: Four times a day (QID) | INTRAVENOUS | Status: DC
  Filled 2011-07-06 (×3): qty 100

## 2011-07-06 MED ORDER — KETOROLAC TROMETHAMINE 30 MG/ML IJ SOLN
30.0000 mg | Freq: Four times a day (QID) | INTRAMUSCULAR | Status: AC
  Administered 2011-07-06: 30 mg via INTRAVENOUS
  Filled 2011-07-06: qty 1

## 2011-07-06 MED ORDER — HYDROMORPHONE 0.3 MG/ML IV SOLN
INTRAVENOUS | Status: DC
  Administered 2011-07-06: 1.5 mg via INTRAVENOUS
  Administered 2011-07-06: 16:00:00 via INTRAVENOUS
  Administered 2011-07-07: 2.3 mg via INTRAVENOUS
  Administered 2011-07-07: 2.4 mg via INTRAVENOUS
  Administered 2011-07-07 (×2): via INTRAVENOUS
  Administered 2011-07-07: 1.6 mg via INTRAVENOUS
  Administered 2011-07-07: 1.8 mg via INTRAVENOUS
  Administered 2011-07-08: 0.3 mg via INTRAVENOUS
  Administered 2011-07-08: 19:00:00 via INTRAVENOUS
  Administered 2011-07-08: 1.8 mg via INTRAVENOUS
  Administered 2011-07-08: 2.4 mg via INTRAVENOUS
  Administered 2011-07-08: 3.95 mg via INTRAVENOUS
  Administered 2011-07-08: 11:00:00 via INTRAVENOUS
  Administered 2011-07-08: 5.58 mg via INTRAVENOUS
  Administered 2011-07-08 – 2011-07-09 (×3): 3 mg via INTRAVENOUS
  Administered 2011-07-09: 06:00:00 via INTRAVENOUS
  Administered 2011-07-09: 1.5 mg via INTRAVENOUS
  Administered 2011-07-09: 3.6 mg via INTRAVENOUS
  Administered 2011-07-09: 2.4 mg via INTRAVENOUS
  Administered 2011-07-09: 0.9 mg via INTRAVENOUS
  Administered 2011-07-09: 2.96 mg via INTRAVENOUS
  Administered 2011-07-10: 03:00:00 via INTRAVENOUS
  Administered 2011-07-10: 3 mg via INTRAVENOUS
  Administered 2011-07-10: 2.7 mg via INTRAVENOUS
  Administered 2011-07-10: 3.3 mg via INTRAVENOUS
  Administered 2011-07-10: 3.9 mg via INTRAVENOUS
  Administered 2011-07-10: 1.5 mg via INTRAVENOUS
  Administered 2011-07-11: 06:00:00 via INTRAVENOUS
  Administered 2011-07-11: 0.3 mg via INTRAVENOUS

## 2011-07-06 MED ORDER — ACETAMINOPHEN 10 MG/ML IV SOLN
1000.0000 mg | Freq: Once | INTRAVENOUS | Status: AC
  Administered 2011-07-06: 1000 mg via INTRAVENOUS
  Filled 2011-07-06: qty 100

## 2011-07-06 MED ORDER — ACETAMINOPHEN 10 MG/ML IV SOLN: 1000.0000 mg | Freq: Four times a day (QID) | INTRAVENOUS | Status: DC

## 2011-07-06 MED ORDER — LORAZEPAM BOLUS VIA INFUSION: 1.0000 mg | Freq: Four times a day (QID) | INTRAVENOUS | Status: DC | PRN

## 2011-07-06 MED ORDER — HYDROMORPHONE 0.3 MG/ML IV SOLN
INTRAVENOUS | Status: AC
  Filled 2011-07-06: qty 25

## 2011-07-06 NOTE — Progress Notes (Signed)
2 Days Post-Op  Subjective: The patient had a relatively quiet day with stable vital signs. Pain was rated at 5 at of 10 during the day but now is 10 out of 10 and she is uncomfortable. She is voiding well. She denies nausea. Tolerated clear liquids.   Lab work yesterday revealed normal WBC, normal differential, normal lipase.  Objective: Vital signs in last 24 hours: Temp:  [98.2 F (36.8 C)-98.7 F (37.1 C)] 98.7 F (37.1 C) (03/28 0615) Pulse Rate:  [62-87] 86  (03/28 0615) Resp:  [13-24] 18  (03/28 0615) BP: (110-128)/(78-86) 114/80 mmHg (03/28 0615) SpO2:  [94 %-100 %] 98 % (03/28 0615) Last BM Date:  (PTA)  Intake/Output from previous day: 03/27 0701 - 03/28 0700 In: 1946 [I.V.:1946] Out: 800 [Urine:800] Intake/Output this shift: Total I/O In: 1946 [I.V.:1946] Out: 400 [Urine:400]  General appearance: alert but in distress from abdominal pain. Careful. GI: abdomen is soft but diffusely tender. Minimal bowel sounds. Wounds looked fine. Some guarding.  Lab Results:  No results found for this or any previous visit (from the past 24 hour(s)).   Studies/Results: @RISRSLT24 @     . ertapenem (INVANZ) IV  1 g Intravenous Q24H  . heparin  5,000 Units Subcutaneous Q8H  . morphine   Intravenous Q4H  . DISCONTD: morphine   Intravenous Q4H  . DISCONTD: morphine   Intravenous Q4H     Assessment/Plan: s/p Procedure(s): LAPAROSCOPIC VENTRAL HERNIA INSERTION OF MESH ENTERORRHAPHY  Postop abdominal pain seems out of proportion to the surgery performed.  Proceed with Stat CT scan of abdomen and pelvis with contrast at to rule out complication. If any abnormalities identified she may need reexploration.  We'll keep n.p.o. For now.   Patient Active Hospital Problem List: No active hospital problems.   LOS: 2 days    Linzie Criss M 07/06/2011  . .prob

## 2011-07-06 NOTE — Progress Notes (Signed)
Interval progress note:   CT scan shows no obvious complication. Specifically I have reviewed this with 2 radiologists and have discussed the operative events and clinical course to date. There is no evidence of any bowel perforation, inflammation or obstruction. There is no bowel wall thickening. There is no free air. The colon is somewhat distended suggesting colonic ileus. The stomach and pancreas looked normal. The left upper quadrant looks fine. There is a little bit of fluid and air in the epigastric hernia sac, but this is not unexpected. The hernia repair appears intact.  Lab work shows a WBC of 6700.hemoglobin is stable at 11.6,chemistries look fine. Lipase is 12.  The patient's pain has varied between 4 and 10 today. She has not had any nausea. She states that she would like to drink some liquids.  Her biggest concern is pain control which is in adequate. She states this pain is similar to the gas pain she had after her C-section. She seems quite anxious as well. She is reticent, tearful and not interacting that well but she is otherwise ambulatory and alert.   Exam:  Vital signs are stable. There is no fever, no tachycardia, no oxygen  Desaturation.She is anxious and tearful, but alert and appropriate.  Abdomen reveals active bowel sounds, soft  but she is diffusely tender. There is no localized finding. This extends all the way from the right flank to the the left flank. She is tender but she does soften up when distracted.    Assessment:     Although this is a difficult evaluation, my best judgment at this point in time is that she is having inadequate pain control and possibly superimposed gas pains. This is similar to her C-section clinical course.  Anxiety is also a problem.  Intraabdominal complication or missed injury seems less likely considering stable VS, normal WBC, active bowel sounds, and unremarkable CT.  I have discussed and reviewed  this with Dr. Corliss Skains, one of my  partners. We both feel that the appropriate clinical course is to continue to treat the pain and treat her conservatively, as there does not appear to be enough evidence to support reexploration.  If she were to develop fever, leukocytosis or tachycardia then we would need to re- think  re-exploration.   Plan:  We will try full dose Dilaudid PCA, IV tylenol, and PRN Toradol for pain control Will add PRN Ativan for anxiety and agitation. Allow clear liquids Allow Mylicon as needed Fleets enema x1 Recheck CBC tomorrow   Nicole Humphrey. Derrell Lolling, M.D., [facility] General and Minimally invasive Surgery Breast and Colorectal Surgery Office:   [phone] Pager:   [phone]

## 2011-07-07 ENCOUNTER — Encounter (HOSPITAL_COMMUNITY): Payer: Self-pay | Admitting: General Surgery

## 2011-07-07 LAB — BASIC METABOLIC PANEL
GFR calc Af Amer: >90 mL/min (ref >90)
GFR calc non Af Amer: >90 mL/min (ref >90)
Glucose, Bld: 70 mg/dL (ref 70–99)
Potassium: 3.7 mEq/L (ref 3.5–5.1)
Sodium: 134 mEq/L — ABNORMAL LOW (ref 135–145)

## 2011-07-07 LAB — CBC
Hemoglobin: 11.5 g/dL — ABNORMAL LOW (ref 12.0–15.0)
MCHC: 33.1 g/dL (ref 30.0–36.0)
RDW: 14.1 % (ref 11.5–15.5)

## 2011-07-07 LAB — DIFFERENTIAL
Basophils Absolute: 0 10*3/uL (ref 0.0–0.1)
Lymphocytes Relative: 28 % (ref 12–46)
Neutro Abs: 3.9 10*3/uL (ref 1.7–7.7)
Neutrophils Relative %: 63 % (ref 43–77)

## 2011-07-07 MED ORDER — HYDROMORPHONE 0.3 MG/ML IV SOLN
INTRAVENOUS | Status: AC
  Filled 2011-07-07: qty 25

## 2011-07-07 MED ORDER — HYDROMORPHONE 0.3 MG/ML IV SOLN
INTRAVENOUS | Status: AC
  Administered 2011-07-07: 5.4 mg
  Filled 2011-07-07: qty 25

## 2011-07-07 NOTE — Progress Notes (Signed)
Patient ID: Nicole Humphrey, female   DOB: 05-18-1975, 36 y.o.   MRN: [id number] [facility] Progress Note:   3 Days Post-Op  Subjective: Mental status is clear.  Patient is in discomfort and distracted Objective: Vital signs in last 24 hours: Temp:  [98.2 F (36.8 C)-98.7 F (37.1 C)] 98.7 F (37.1 C) (03/29 0900) Pulse Rate:  [73-107] 88  (03/29 0900) Resp:  [10-16] 10  (03/29 0900) BP: (104-126)/(69-80) 126/80 mmHg (03/29 0900) SpO2:  [96 %-100 %] 98 % (03/29 0900)  Intake/Output from previous day: 03/28 0701 - 03/29 0700 In: 3814 [I.V.:3814] Out: 600 [Urine:600] Intake/Output this shift: Total I/O In: 600 [P.O.:600] Out: 300 [Urine:300]  Physical Exam: Work of breathing is  Normal.  Oxygen nasally is in place.    Lab Results:  Results for orders placed during the hospital encounter of 07/04/11 (from the past 48 hour(s))  CBC     Status: Abnormal   Collection Time   07/06/11  6:47 AM      Component Value Range Comment   WBC 6.7  4.0 - 10.5 (K/uL)    RBC 4.05  3.87 - 5.11 (MIL/uL)    Hemoglobin 11.6 (*) 12.0 - 15.0 (g/dL)    HCT 40.9 (*) 81.1 - 46.0 (%)    MCV 85.9  78.0 - 100.0 (fL)    MCH 28.6  26.0 - 34.0 (pg)    MCHC 33.3  30.0 - 36.0 (g/dL)    RDW 91.4  78.2 - 95.6 (%)    Platelets 185  150 - 400 (K/uL)   LIPASE, BLOOD     Status: Normal   Collection Time   07/06/11  6:47 AM      Component Value Range Comment   Lipase 12  11 - 59 (U/L)   COMPREHENSIVE METABOLIC PANEL     Status: Abnormal   Collection Time   07/06/11  6:47 AM      Component Value Range Comment   Sodium 134 (*) 135 - 145 (mEq/L)    Potassium 3.9  3.5 - 5.1 (mEq/L)    Chloride 100  96 - 112 (mEq/L)    CO2 25  19 - 32 (mEq/L)    Glucose, Bld 69 (*) 70 - 99 (mg/dL)    BUN 4 (*) 6 - 23 (mg/dL)    Creatinine, Ser 2.13  0.50 - 1.10 (mg/dL)    Calcium 8.7  8.4 - 10.5 (mg/dL)    Total Protein 6.4  6.0 - 8.3 (g/dL)    Albumin 3.3 (*) 3.5 - 5.2 (g/dL)    AST 14  0 - 37 (U/L)    ALT 9  0 - 35 (U/L)    Alkaline Phosphatase 53  39 - 117 (U/L)    Total Bilirubin 0.5  0.3 - 1.2 (mg/dL)    GFR calc non Af Amer >90  >90 (mL/min)    GFR calc Af Amer >90  >90 (mL/min)   CBC     Status: Abnormal   Collection Time   07/07/11  4:22 AM      Component Value Range Comment   WBC 6.2  4.0 - 10.5 (K/uL)    RBC 4.03  3.87 - 5.11 (MIL/uL)    Hemoglobin 11.5 (*) 12.0 - 15.0 (g/dL)    HCT 08.6 (*) 57.8 - 46.0 (%)    MCV 86.1  78.0 - 100.0 (fL)    MCH 28.5  26.0 - 34.0 (pg)    MCHC 33.1  30.0 -  36.0 (g/dL)    RDW 91.4  78.2 - 95.6 (%)    Platelets 223  150 - 400 (K/uL)   BASIC METABOLIC PANEL     Status: Abnormal   Collection Time   07/07/11  4:22 AM      Component Value Range Comment   Sodium 134 (*) 135 - 145 (mEq/L)    Potassium 3.7  3.5 - 5.1 (mEq/L)    Chloride 99  96 - 112 (mEq/L)    CO2 23  19 - 32 (mEq/L)    Glucose, Bld 70  70 - 99 (mg/dL)    BUN 4 (*) 6 - 23 (mg/dL)    Creatinine, Ser 2.13  0.50 - 1.10 (mg/dL)    Calcium 8.8  8.4 - 10.5 (mg/dL)    GFR calc non Af Amer >90  >90 (mL/min)    GFR calc Af Amer >90  >90 (mL/min)   DIFFERENTIAL     Status: Normal   Collection Time   07/07/11  4:22 AM      Component Value Range Comment   Neutrophils Relative 63  43 - 77 (%)    Neutro Abs 3.9  1.7 - 7.7 (K/uL)    Lymphocytes Relative 28  12 - 46 (%)    Lymphs Abs 1.7  0.7 - 4.0 (K/uL)    Monocytes Relative 4  3 - 12 (%)    Monocytes Absolute 0.2  0.1 - 1.0 (K/uL)    Eosinophils Relative 5  0 - 5 (%)    Eosinophils Absolute 0.3  0.0 - 0.7 (K/uL)    Basophils Relative 0  0 - 1 (%)    Basophils Absolute 0.0  0.0 - 0.1 (K/uL)     Radiology/Results: Ct Abdomen Pelvis W Contrast  07/06/2011  *RADIOLOGY REPORT*  Clinical Data: Chest post epigastric/umbilical hernia repair 07/04/2011.  Abdominal pain.  Question bowel perforation or abscess.  CT ABDOMEN AND PELVIS WITH CONTRAST  Technique:  Multidetector CT imaging of the abdomen and pelvis was performed following the  standard protocol during bolus administration of intravenous contrast.  Contrast:  100 ml Omnipaque-300.  Comparison: None.  Findings: The patient has small bilateral pleural effusions, greater on the left.  No pericardial effusion.  Heart size is normal.  Dependent atelectasis is identified.  There is a small amount of perihepatic ascites.  Small amount of free pelvic fluid is also identified.  No rim enhancing fluid collection to suggest abscess is seen within the abdomen or pelvis. There is no free intraperitoneal air.  There is some air in the abdominal wall, more notable on the left. Mesh from hernia repair is identified.  There is a small fluid collection in the anterior abdominal wall at the level of the umbilicus measuring 3.1 cm transverse by 3.6 cm cranial-caudal by 2.0 cm AP.  No gas within the collection is identified and there is no rim enhancement.  A second small fluid collection is identified in the anterior abdominal wall at the level of the umbilicus measuring 2.3 cm cranial-caudal by 1.2 cm AP by 2.3 cm transverse. This collection does contain a locule of gas but there is no notable rim enhancement.  The liver, gallbladder, adrenal glands, spleen and pancreas appear normal.  Punctate hyperattenuating focus in the upper pole of the right kidney is compatible with a tiny stone.  The kidneys are otherwise unremarkable.  Uterus, adnexa and urinary bladder appear normal.  There is no lymphadenopathy.  No focal bony abnormality.  IMPRESSION:  1.  Two small fluid collections in the anterior abdominal wall as described above cannot be definitively characterized.  Their appearance is most suggestive of postoperative change/seromas rather than abscesses.  There is no evidence of bowel perforation or other acute abnormality. 2.  Small volume of perihepatic and free pelvic fluid is compatible with postoperative change. 3.  Tiny bilateral pleural effusions. 4.  Punctate nonobstructing stone upper pole right  kidney.  Original Report Authenticated By: Bernadene Bell. Maricela Curet, M.D.    Anti-infectives: Anti-infectives     Start     Dose/Rate Route Frequency Ordered Stop   07/05/11 0800   ertapenem (INVANZ) 1 g in sodium chloride 0.9 % 50 mL IVPB        1 g 100 mL/hr over 30 Minutes Intravenous Every 24 hours 07/04/11 1327 07/05/11 0827   07/04/11 0606   ceFAZolin (ANCEF) IVPB 1 g/50 mL premix        1 g 100 mL/hr over 30 Minutes Intravenous 60 min pre-op 07/04/11 0606 07/04/11 0725          Assessment/Plan: Problem List: Patient Active Problem List  Diagnoses  . Hernia, epigastric  . Hernia, umbilical  . Term birth of newborn    Vitals and lab are normal.  Do not suggest any unrecognized abscess in the abdomen.  Abdominal wall pain is related to surgery.  Will continue to observe.  Check lab in am. 3 Days Post-Op    LOS: 3 days   Matt B. Daphine Deutscher, MD, [facility] [phone] beeper [phone]  07/07/2011 11:40 AM

## 2011-07-07 NOTE — Progress Notes (Signed)
Patient reported unhappiness that MD did not spend time to answer her questions/concerns when rounding.  MD, please address in weekend rounding.

## 2011-07-07 NOTE — Progress Notes (Signed)
Patient has sustained heart rate from 90's to 143 for most of the day.  Still rating her pain at 6/10.  We discussed non-pharmacalogical ways to slow her heart rate, including relaxation breathing.  Patient denies history of chronic tachycardia, except during delivery of her first child.  Will continue to monitor.

## 2011-07-08 ENCOUNTER — Inpatient Hospital Stay (HOSPITAL_COMMUNITY): Payer: 59

## 2011-07-08 LAB — DIFFERENTIAL
Lymphocytes Relative: 35 % (ref 12–46)
Monocytes Absolute: 0.2 10*3/uL (ref 0.1–1.0)
Monocytes Relative: 4 % (ref 3–12)
Neutro Abs: 3.3 10*3/uL (ref 1.7–7.7)

## 2011-07-08 LAB — CBC
HCT: 35 % — ABNORMAL LOW (ref 36.0–46.0)
Hemoglobin: 11.9 g/dL — ABNORMAL LOW (ref 12.0–15.0)
RBC: 4.14 MIL/uL (ref 3.87–5.11)
WBC: 6 10*3/uL (ref 4.0–10.5)

## 2011-07-08 MED ORDER — POLYETHYLENE GLYCOL 3350 17 G PO PACK
17.0000 g | PACK | Freq: Every day | ORAL | Status: DC
  Administered 2011-07-08 – 2011-07-10 (×4): 17 g via ORAL
  Filled 2011-07-08 (×4): qty 1

## 2011-07-08 MED ORDER — ACETAMINOPHEN 10 MG/ML IV SOLN
1000.0000 mg | Freq: Four times a day (QID) | INTRAVENOUS | Status: AC
  Administered 2011-07-08 – 2011-07-09 (×4): 1000 mg via INTRAVENOUS
  Filled 2011-07-08 (×4): qty 100

## 2011-07-08 MED ORDER — HYDROMORPHONE 0.3 MG/ML IV SOLN
INTRAVENOUS | Status: AC
  Filled 2011-07-08: qty 25

## 2011-07-08 NOTE — Progress Notes (Signed)
4 Days Post-Op  Subjective: About the same with abdominal pain no worse she thinks. No BM.  Objective: Vital signs in last 24 hours: Temp:  [98.7 F (37.1 C)-99.3 F (37.4 C)] 99.3 F (37.4 C) (03/30 0439) Pulse Rate:  [82-98] 82  (03/30 0439) Resp:  [9-16] 10  (03/30 0439) BP: (115-126)/(75-80) 115/75 mmHg (03/30 0439) SpO2:  [97 %-99 %] 99 % (03/30 0439) Last BM Date:  (PTA)  Intake/Output from previous day: 03/29 0701 - 03/30 0700 In: 1080 [P.O.:1080] Out: 1250 [Urine:1250] Intake/Output this shift:    abdomen slightly distended.  Tender  in all 4 quadrants but no peritonitis.  Port sites clean.  quiet.  Lab Results:   Basename 07/08/11 0350 07/07/11 0422  WBC 6.0 6.2  HGB 11.9* 11.5*  HCT 35.0* 34.7*  PLT 247 223   BMET  Basename 07/07/11 0422 07/06/11 0647  NA 134* 134*  K 3.7 3.9  CL 99 100  CO2 23 25  GLUCOSE 70 69*  BUN 4* 4*  CREATININE 0.53 0.51  CALCIUM 8.8 8.7   PT/INR No results found for this basename: LABPROT:2,INR:2 in the last 72 hours ABG No results found for this basename: PHART:2,PCO2:2,PO2:2,HCO3:2 in the last 72 hours  Studies/Results: Ct Abdomen Pelvis W Contrast  07/06/2011  *RADIOLOGY REPORT*  Clinical Data: Chest post epigastric/umbilical hernia repair 07/04/2011.  Abdominal pain.  Question bowel perforation or abscess.  CT ABDOMEN AND PELVIS WITH CONTRAST  Technique:  Multidetector CT imaging of the abdomen and pelvis was performed following the standard protocol during bolus administration of intravenous contrast.  Contrast:  100 ml Omnipaque-300.  Comparison: None.  Findings: The patient has small bilateral pleural effusions, greater on the left.  No pericardial effusion.  Heart size is normal.  Dependent atelectasis is identified.  There is a small amount of perihepatic ascites.  Small amount of free pelvic fluid is also identified.  No rim enhancing fluid collection to suggest abscess is seen within the abdomen or pelvis. There is no  free intraperitoneal air.  There is some air in the abdominal wall, more notable on the left. Mesh from hernia repair is identified.  There is a small fluid collection in the anterior abdominal wall at the level of the umbilicus measuring 3.1 cm transverse by 3.6 cm cranial-caudal by 2.0 cm AP.  No gas within the collection is identified and there is no rim enhancement.  A second small fluid collection is identified in the anterior abdominal wall at the level of the umbilicus measuring 2.3 cm cranial-caudal by 1.2 cm AP by 2.3 cm transverse. This collection does contain a locule of gas but there is no notable rim enhancement.  The liver, gallbladder, adrenal glands, spleen and pancreas appear normal.  Punctate hyperattenuating focus in the upper pole of the right kidney is compatible with a tiny stone.  The kidneys are otherwise unremarkable.  Uterus, adnexa and urinary bladder appear normal.  There is no lymphadenopathy.  No focal bony abnormality.  IMPRESSION:  1.  Two small fluid collections in the anterior abdominal wall as described above cannot be definitively characterized.  Their appearance is most suggestive of postoperative change/seromas rather than abscesses.  There is no evidence of bowel perforation or other acute abnormality. 2.  Small volume of perihepatic and free pelvic fluid is compatible with postoperative change. 3.  Tiny bilateral pleural effusions. 4.  Punctate nonobstructing stone upper pole right kidney.  Original Report Authenticated By: Bernadene Bell. Maricela Curet, M.D.    Anti-infectives:  Anti-infectives     Start     Dose/Rate Route Frequency Ordered Stop   07/05/11 0800   ertapenem (INVANZ) 1 g in sodium chloride 0.9 % 50 mL IVPB        1 g 100 mL/hr over 30 Minutes Intravenous Every 24 hours 07/04/11 1327 07/05/11 0827   07/04/11 0606   ceFAZolin (ANCEF) IVPB 1 g/50 mL premix        1 g 100 mL/hr over 30 Minutes Intravenous 60 min pre-op 07/04/11 0606 07/04/11 0725           Assessment/Plan: s/p Procedure(s) (LRB): LAPAROSCOPIC VENTRAL HERNIA (N/A) INSERTION OF MESH (N/A) Ambulate Miralax.  Check KUB today. I think most of her pain is secondary to sutures.  She has no peritonitis and vitals labs look normal.  Continue to follow.  LOS: 4 days    Rayhaan Huster A. 07/08/2011

## 2011-07-09 DIAGNOSIS — K439 Ventral hernia without obstruction or gangrene: Secondary | ICD-10-CM

## 2011-07-09 MED ORDER — HYDROMORPHONE 0.3 MG/ML IV SOLN
INTRAVENOUS | Status: AC
  Administered 2011-07-09: 17:00:00
  Filled 2011-07-09: qty 25

## 2011-07-09 MED ORDER — HYDROMORPHONE 0.3 MG/ML IV SOLN
INTRAVENOUS | Status: AC
  Administered 2011-07-09: 06:00:00
  Filled 2011-07-09: qty 25

## 2011-07-09 NOTE — Progress Notes (Signed)
Patient ID: Nicole Humphrey, female   DOB: Aug 10, 1975, 36 y.o.   MRN: [id number] [facility] Surgery Progress Note:   5 Days Post-Op  Subjective: Mental status is clear.  She is quiet and reticent.  She is frustrated by what appears to be an ileus.  I started her on IV acetaminophen yesterday and I believe that her pain is better but she hasn't had a bm or passed much gas.  She just drank Miralax.   Objective: Vital signs in last 24 hours: Temp:  [98.5 F (36.9 C)-99.2 F (37.3 C)] 98.5 F (36.9 C) (03/31 0615) Pulse Rate:  [81-104] 104  (03/31 0615) Resp:  [10-18] 18  (03/31 0748) BP: (102-126)/(68-82) 122/78 mmHg (03/31 0615) SpO2:  [98 %-100 %] 99 % (03/31 0748) FiO2 (%):  [41 %] 41 % (03/30 1157)  Intake/Output from previous day: 03/30 0701 - 03/31 0700 In: 2153.3 [P.O.:480; I.V.:1473.3; IV Piggyback:200] Out: 500 [Urine:500] Intake/Output this shift:    Physical Exam: Work of breathing is  Normal.  Sore in abdomen  Lab Results:  Results for orders placed during the hospital encounter of 07/04/11 (from the past 48 hour(s))  CBC     Status: Abnormal   Collection Time   07/08/11  3:50 AM      Component Value Range Comment   WBC 6.0  4.0 - 10.5 (K/uL)    RBC 4.14  3.87 - 5.11 (MIL/uL)    Hemoglobin 11.9 (*) 12.0 - 15.0 (g/dL)    HCT 82.9 (*) 56.2 - 46.0 (%)    MCV 84.5  78.0 - 100.0 (fL)    MCH 28.7  26.0 - 34.0 (pg)    MCHC 34.0  30.0 - 36.0 (g/dL)    RDW 13.0  86.5 - 78.4 (%)    Platelets 247  150 - 400 (K/uL)   DIFFERENTIAL     Status: Abnormal   Collection Time   07/08/11  3:50 AM      Component Value Range Comment   Neutrophils Relative 55  43 - 77 (%)    Neutro Abs 3.3  1.7 - 7.7 (K/uL)    Lymphocytes Relative 35  12 - 46 (%)    Lymphs Abs 2.1  0.7 - 4.0 (K/uL)    Monocytes Relative 4  3 - 12 (%)    Monocytes Absolute 0.2  0.1 - 1.0 (K/uL)    Eosinophils Relative 7 (*) 0 - 5 (%)    Eosinophils Absolute 0.4  0.0 - 0.7 (K/uL)    Basophils Relative 0   0 - 1 (%)    Basophils Absolute 0.0  0.0 - 0.1 (K/uL)     Radiology/Results: Dg Abd 1 View  07/08/2011  *RADIOLOGY REPORT*  Clinical Data: Abdominal pain.  History of hernia repair 4 days ago.  ABDOMEN - 1 VIEW  Comparison: CT of the abdomen and pelvis dated 07/06/2011.  Findings: There is residual oral contrast material noted within the proximal colon.  Gas is seen extending to the level of the distal sigmoid colon.  There is a paucity of distal rectal gas.  Multiple nondilated loops of gas-filled small bowel are evident in the central abdomen.  No definite pathologic dilatation of bowel is identified.  No gross evidence of pneumoperitoneum on this single supine view.  Multiple radiopaque markers project over the central abdomen related to the mesh placement for ventral hernia repair.  IMPRESSION: 1.  Nonspecific nonobstructive bowel gas pattern, as above.  No pneumoperitoneum.  Original Report Authenticated  By: Florencia Reasons, M.D.    Anti-infectives: Anti-infectives     Start     Dose/Rate Route Frequency Ordered Stop   07/05/11 0800   ertapenem (INVANZ) 1 g in sodium chloride 0.9 % 50 mL IVPB        1 g 100 mL/hr over 30 Minutes Intravenous Every 24 hours 07/04/11 1327 07/05/11 0827   07/04/11 0606   ceFAZolin (ANCEF) IVPB 1 g/50 mL premix        1 g 100 mL/hr over 30 Minutes Intravenous 60 min pre-op 07/04/11 0606 07/04/11 0725          Assessment/Plan: Problem List: Patient Active Problem List  Diagnoses  . Hernia, epigastric  . Hernia, umbilical  . Term birth of newborn    Ileus and pain after ventral hernia repair.  Slowly resolving 5 Days Post-Op    LOS: 5 days   Matt B. Daphine Deutscher, MD, [facility] [phone] beeper [phone]  07/09/2011 10:53 AM

## 2011-07-10 MED ORDER — HYDROMORPHONE 0.3 MG/ML IV SOLN
INTRAVENOUS | Status: AC
  Filled 2011-07-10: qty 25

## 2011-07-10 MED ORDER — POLYETHYLENE GLYCOL 3350 17 G PO PACK
17.0000 g | PACK | Freq: Once | ORAL | Status: DC
  Filled 2011-07-10 (×2): qty 1

## 2011-07-10 NOTE — Progress Notes (Signed)
6 Days Post-Op  Subjective: Alert. Mental status clear. Reticent. Says she still has pain. Does not appear to be in much distress. States that she and her husband have more questions once he arrives. I told her I would be happy to answer their questions and to have the nursing staff page me.  Tolerating clear liquids without any nausea or vomiting. No flatus or stool despite use of MiraLAX.  We talked to her about her ileus and possible interventions. She stated that she did not want to reduce the narcotic dose yet.   Objective: Vital signs in last 24 hours: Temp:  [98.4 F (36.9 C)-99.5 F (37.5 C)] 99.2 F (37.3 C) (04/01 0532) Pulse Rate:  [85-98] 93  (04/01 0532) Resp:  [11-16] 11  (04/01 0532) BP: (106-112)/(76-80) 112/80 mmHg (04/01 0532) SpO2:  [99 %-100 %] 100 % (04/01 0532) Last BM Date:  (PTA)  Intake/Output from previous day: 03/31 0701 - 04/01 0700 In: 1698.3 [P.O.:240; I.V.:1458.3] Out: 500 [Urine:500] Intake/Output this shift:    General appearance: mental status clear. Does not appear to be any distress. Reticent. Not very interactive. Depressed affect. Working on her computer Resp: lungs are clear to auscultation bilaterally, including both bases. No rhonchi or wheeze. GI: abdomen is slightly distended, active bowel sounds. Much softer than 3 days ago. All wounds look fine. No evidence for intra-abdominal peritoneal irritation.  Lab Results:  No results found for this or any previous visit (from the past 24 hour(s)).   Studies/Results: @RISRSLT24 @     . acetaminophen  1,000 mg Intravenous Q6H  . heparin  5,000 Units Subcutaneous Q8H  . HYDROmorphone PCA 0.3 mg/mL   Intravenous Q4H  . HYDROmorphone PCA 0.3 mg/mL      . HYDROmorphone PCA 0.3 mg/mL      . polyethylene glycol  17 g Oral Daily  . polyethylene glycol  17 g Oral Once     Assessment/Plan: s/p Procedure(s): LAPAROSCOPIC VENTRAL HERNIA INSERTION OF MESH  Ileus and pain after ventral hernia  repair. Slowly improving. We'll try soaspsuds enema and MiraLAX.  We'll keep narcotics as is. Narcotics may be aggravating the ileus.  Continue clear liquid diet  ambulate      LOS: 6 days    Nicole Humphrey M. Derrell Lolling, M.D., [facility] General and Minimally invasive Surgery Breast and Colorectal Surgery Office:   [phone] Pager:   [phone]  07/10/2011  . .prob

## 2011-07-11 LAB — BASIC METABOLIC PANEL
BUN: <3 mg/dL — ABNORMAL LOW (ref 6–23)
Calcium: 8.8 mg/dL (ref 8.4–10.5)
GFR calc Af Amer: >90 mL/min (ref >90)
GFR calc non Af Amer: >90 mL/min (ref >90)
Glucose, Bld: 67 mg/dL — ABNORMAL LOW (ref 70–99)
Sodium: 135 mEq/L (ref 135–145)

## 2011-07-11 LAB — CBC
MCH: 28.8 pg (ref 26.0–34.0)
MCHC: 34.1 g/dL (ref 30.0–36.0)
Platelets: 197 10*3/uL (ref 150–400)
RBC: 3.89 MIL/uL (ref 3.87–5.11)

## 2011-07-11 MED ORDER — HYDROMORPHONE HCL PF 1 MG/ML IJ SOLN
0.5000 mg | INTRAMUSCULAR | Status: DC | PRN
  Administered 2011-07-11: 0.5 mg via INTRAVENOUS
  Filled 2011-07-11: qty 1

## 2011-07-11 MED ORDER — HYDROMORPHONE HCL PF 1 MG/ML IJ SOLN
0.5000 mg | INTRAMUSCULAR | Status: DC | PRN
  Administered 2011-07-11 – 2011-07-14 (×8): 1 mg via INTRAVENOUS
  Filled 2011-07-11 (×8): qty 1

## 2011-07-11 MED ORDER — HYDROMORPHONE 0.3 MG/ML IV SOLN
INTRAVENOUS | Status: AC
  Administered 2011-07-11: 3 mg
  Filled 2011-07-11: qty 25

## 2011-07-11 MED ORDER — POLYETHYLENE GLYCOL 3350 17 G PO PACK
17.0000 g | PACK | Freq: Two times a day (BID) | ORAL | Status: DC
  Administered 2011-07-11 – 2011-07-14 (×8): 17 g via ORAL
  Filled 2011-07-11 (×11): qty 1

## 2011-07-11 MED ORDER — HYDROCODONE-ACETAMINOPHEN 10-325 MG PO TABS
2.0000 | ORAL_TABLET | ORAL | Status: DC | PRN
  Administered 2011-07-11 – 2011-07-14 (×7): 2 via ORAL
  Administered 2011-07-14: 1 via ORAL
  Administered 2011-07-14 – 2011-07-15 (×3): 2 via ORAL
  Filled 2011-07-11 (×2): qty 2
  Filled 2011-07-11: qty 1
  Filled 2011-07-11 (×2): qty 2
  Filled 2011-07-11: qty 1
  Filled 2011-07-11 (×5): qty 2
  Filled 2011-07-11: qty 1
  Filled 2011-07-11: qty 2

## 2011-07-11 NOTE — Progress Notes (Signed)
7 Days Post-Op  Subjective: Had some brown watery returned from the enema. Has been passing flatus. A little nausea but not much. Tolerating full liquids reasonably well. No real bowel movement. Still has pain, but she states that she is willing to try to transition from parenteral to oral pain medications today.  She is otherwise been stable with normal vital signs and no other problems. Ambulating but moves slowly.  Objective: Vital signs in last 24 hours: Temp:  [98.7 F (37.1 C)-99.4 F (37.4 C)] 98.7 F (37.1 C) (04/02 0607) Pulse Rate:  [91-105] 91  (04/02 0607) Resp:  [9-16] 12  (04/02 0607) BP: (107-117)/(73-79) 107/73 mmHg (04/02 0607) SpO2:  [95 %-100 %] 100 % (04/02 0607) Last BM Date:  (PTA)  Intake/Output from previous day: 04/01 0701 - 04/02 0700 In: 600 [P.O.:600] Out: 600 [Urine:600] Intake/Output this shift:    General appearance: mental status clear. Still quiet and reticent but a little more interactive and a little more animated today. In no clinical distress. GI: abdomen is soft but a little bit distended and tympanitic. Bowel sounds are present. All the wounds look fine. No signs of peritoneal irritation.  Lab Results:  No results found for this or any previous visit (from the past 24 hour(s)).   Studies/Results: @RISRSLT24 @     . heparin  5,000 Units Subcutaneous Q8H  . HYDROmorphone PCA 0.3 mg/mL   Intravenous Q4H  . HYDROmorphone PCA 0.3 mg/mL      . HYDROmorphone PCA 0.3 mg/mL      . HYDROmorphone PCA 0.3 mg/mL      . polyethylene glycol  17 g Oral Daily  . polyethylene glycol  17 g Oral Once     Assessment/Plan: s/p Procedure(s): LAPAROSCOPIC VENTRAL HERNIA INSERTION OF MESH  Prolonged postop ileus and pain after ventral hernia repair. No apparent complication. Slowly improving. We'll continue MiraLAX twice a day. Full liquid diet. Discontinue Dilaudid PCA but allow oral hydrocodone and rescue IV Dilaudid as necessary. Hopefully this  will help with resolution of ileus.  Check lab work today  Shower PRN  Ambulate as much as possible.  She is comfortable with this plan.    LOS: 7 days    Maecy Podgurski M. Derrell Lolling, M.D., [facility] General and Minimally invasive Surgery Breast and Colorectal Surgery Office:   [phone] Pager:   [phone]  07/11/2011  . .prob

## 2011-07-11 NOTE — Progress Notes (Signed)
MD, Pt. Reports severe itching on abdomen that began yesterday.  Pt.'s abdomen appears to have large rash/welps that look like a site reaction due to heparin injections.  Please assess this area on patient.  Held heparin injection last night but continued SCDs for DVT protocol.  Pt. Has been receiving benadryl q6 hours as needed.  Thanks, Nsg.

## 2011-07-12 ENCOUNTER — Inpatient Hospital Stay (HOSPITAL_COMMUNITY): Payer: 59

## 2011-07-12 NOTE — Progress Notes (Signed)
Pharmacy: Heparin reaction  Abdomen with areas of redness around 3-34 Heparin SQ inj sites. Lap insertion wounds surrounding umbilicus; Heparin injections all given either side of abdomen.   Local histamine reaction possible, pt denies any allergy to pork (heparin derived from pork product). Abdominal binder may be causing some irritation and aggravating any swelling around SQ injection sites.    Heparin has been discontinued, Benadryl seems best treatment for itch. More padding for binder supplied.   Will monitor sites, have nothing more to offer. Thank you,  Otho Bellows PharmD 2:57 PM

## 2011-07-12 NOTE — Progress Notes (Signed)
8 Days Post-Op  Subjective: Passing a lot of flatus but still no stool, despite regular miralax.  . Ambulating. Taking by mouth pain medication but still requiring IV Dilaudid for breakthrough pain.  She states that she is tired of liquids, has no nausea, and wants to eat regular food.  Her pain is a combination of incisional pain and intermittent gas cramps.  She has had some frustrations, possibly with the nursing staff, and is discussing this with the nursing supervisor.  Objective: Vital signs in last 24 hours: Temp:  [98.2 F (36.8 C)-98.5 F (36.9 C)] 98.2 F (36.8 C) (04/03 0540) Pulse Rate:  [63-100] 63  (04/03 0540) Resp:  [14-16] 16  (04/03 0540) BP: (115-148)/(78-90) 119/78 mmHg (04/03 0540) SpO2:  [97 %-99 %] 99 % (04/03 0540) Last BM Date:  (PTA; small results from enema on 4/1)  Intake/Output from previous day: 04/02 0701 - 04/03 0700 In: 1653 [I.V.:1653] Out: -  Intake/Output this shift:    General appearance: alert. Mental status basically normal, although seems depressed by current circumstances. She is in no distress. Skin is warm and dry. GI: abdominal exam is unchanged. Borderline distended. Very active bowel sounds. Quite soft with some incisional tenderness. All the incisions look fine. She does have a small tender seroma at the site of the epigastric hernia that I explained this to her, including the fact that was seen on the CT scan. I told her this would hopefully slow to resolve.  Lab Results:  No results found for this or any previous visit (from the past 24 hour(s)).   Studies/Results: @RISRSLT24 @     . HYDROmorphone PCA 0.3 mg/mL      . polyethylene glycol  17 g Oral Once  . polyethylene glycol  17 g Oral BID  . DISCONTD: heparin  5,000 Units Subcutaneous Q8H     Assessment/Plan: s/p Procedure(s): LAPAROSCOPIC VENTRAL HERNIA INSERTION OF MESH  Prolonged postop ileus and incisional pain without apparent complication. Progress is  frustratingly slow. Appetite improving, this is an encouraging sign.  I offered her enemas and she declines any further enemas. We will continue MiraLAX Lowfat diet will be started Decrease IV to 50 cc per hour Continue current pain regimen  Lab work yesterday was completely normal  We'll check abdominal films today to see if this helps in  any way  I have offered to review her lab tests and x-ray findings with any member of the family if she so chooses. She and the nursing staff note the patient may if this is their desire.    LOS: 8 days   Lovena Kluck M. Derrell Lolling, M.D., [facility] General and Minimally invasive Surgery Breast and Colorectal Surgery Office:   [phone] Pager:   [phone]   07/12/2011  . .prob

## 2011-07-13 MED ORDER — METOCLOPRAMIDE HCL 10 MG PO TABS
10.0000 mg | ORAL_TABLET | Freq: Three times a day (TID) | ORAL | Status: DC
  Administered 2011-07-13 – 2011-07-15 (×8): 10 mg via ORAL
  Filled 2011-07-13 (×11): qty 1

## 2011-07-13 NOTE — Progress Notes (Signed)
Pain much less. Tolerating regular diet. Lots of flatus but no stool.  xrays show colonic ileus, but all CT contrast has gone.. No small bowel distention  Exam:  abd soft, benign, slightly distended, small seroma  I went over clinical course and CT with patient, husband, sister.  Asking if she can go home without BM  Imp:  She is slowly improving without obvious surgical complication.  Plan:   Po reglan,reassess tomorrow. Hope home soon.   Angelia Mould. Derrell Lolling, M.D., [facility] General and Minimally invasive Surgery Breast and Colorectal Surgery Office:   [phone] Pager:   [phone]

## 2011-07-14 ENCOUNTER — Telehealth (INDEPENDENT_AMBULATORY_CARE_PROVIDER_SITE_OTHER): Payer: Self-pay

## 2011-07-14 MED ORDER — LORAZEPAM 1 MG PO TABS: 1.0000 mg | ORAL_TABLET | Freq: Four times a day (QID) | ORAL | Status: DC | PRN

## 2011-07-14 NOTE — Progress Notes (Signed)
10 Days Post-Op  Subjective: She continues to improve and feels better. She had a bowel movement this morning, but got somewhat vagal and dizzy at that time which scared her. She feels fine now. She tolerated breakfast just fine. Her pain is under good control.  She is afraid to go home today because of the vagal reaction and was to stay until tomorrow. Considering the difficulty that she has had with her postop recovery, I feel that is reasonable.  Objective: Vital signs in last 24 hours: Temp:  [98.3 F (36.8 C)-98.4 F (36.9 C)] 98.4 F (36.9 C) (04/05 0449) Pulse Rate:  [77-91] 91  (04/05 0449) Resp:  [16-18] 18  (04/05 0449) BP: (108-121)/(69-85) 121/85 mmHg (04/05 0449) SpO2:  [95 %-97 %] 95 % (04/05 0449) Last BM Date:  (PTA)  Intake/Output from previous day: 04/04 0701 - 04/05 0700 In: 382 [I.V.:382] Out: -  Intake/Output this shift:    General appearance: alert. Interactive. In no distress. Talkative and slightly anxious. GI: abdomen is quite soft. All of her incisions look fine. If I push hard there is some tenderness at the surgical sites. No evidence of any peritoneal inflammation.   Lab Results:  No results found for this or any previous visit (from the past 24 hour(s)).   Studies/Results: @RISRSLT24 @     . metoCLOPramide  10 mg Oral TID AC & HS  . polyethylene glycol  17 g Oral Once  . polyethylene glycol  17 g Oral BID     Assessment/Plan: s/p Procedure(s): LAPAROSCOPIC VENTRAL HERNIA INSERTION OF MESH  Continues to improve without obvious surgical complication. Ileus resolving Possible vagal reaction anxiety  Will discontinue IV. Patient encouraged to stay well hydrated and annulate frequently. We'll reassess in a.m. And hopefully discharge tomorrow    LOS: 10 days    Dedee Liss M. Derrell Lolling, M.D., [facility] General and Minimally invasive Surgery Breast and Colorectal Surgery Office:   [phone] Pager:    [phone]  07/14/2011  . .prob

## 2011-07-14 NOTE — Telephone Encounter (Signed)
Pt notified of po appt. Pt states she is still IP but will call and r/s if MD wants later date.

## 2011-07-15 MED ORDER — HYDROCODONE-ACETAMINOPHEN 5-325 MG PO TABS: 1.0000 | ORAL_TABLET | ORAL | Status: AC | PRN

## 2011-07-15 NOTE — Progress Notes (Signed)
11 Days Post-Op  Subjective: She has her Banks pack and states that she is going home today. She feels pretty good. She's had some more bowel movements. No new problems.  We had a very long talk about medications. We agreed that she should avoid the use of Benadryl unless she really has a skin rash and that she should avoid the use of Ativan. She will be given a prescription for Norco. We will discontinue the Reglan. She will continue to use MiraLAX as needed and try to follow a high-fiber low-fat diet.  Objective: Vital signs in last 24 hours: Temp:  [98.3 F (36.8 C)-98.4 F (36.9 C)] 98.4 F (36.9 C) (04/06 0527) Pulse Rate:  [79-94] 85  (04/06 0527) Resp:  [18] 18  (04/06 0527) BP: (105-117)/(70-75) 105/70 mmHg (04/06 0527) SpO2:  [94 %-99 %] 97 % (04/06 0527) Last BM Date: 07/14/11  Intake/Output from previous day: 04/05 0701 - 04/06 0700 In: 620 [P.O.:620] Out: -  Intake/Output this shift:    General appearance: alert. Middle status normal. In no distress. Seems happy that she is going home. GI: abdomen is soft. Minimal tenderness. Wounds all look fine.  Lab Results:  No results found for this or any previous visit (from the past 24 hour(s)).   Studies/Results: @RISRSLT24 @     . metoCLOPramide  10 mg Oral TID AC & HS  . polyethylene glycol  17 g Oral Once  . polyethylene glycol  17 g Oral BID     Assessment/Plan: s/p Procedure(s): LAPAROSCOPIC VENTRAL HERNIA INSERTION OF MESH   Doing well and ready for discharge from a  medical point of view. Prolonged ileus, finally resolved. Postop pain out of proportion to surgical procedure, resolving.  Colonic ileus, prolonged, finally resolving.  Anxiety, resolving  Will discharge home today. Prescription for vicodin  Diet and activities discussed.  Return to see me 3 weeks, sooner if there are any problems.    LOS: 11 days    Marilyne Haseley M. Derrell Lolling, M.D., [facility] General and  Minimally invasive Surgery Breast and Colorectal Surgery Office:   [phone] Pager:   [phone]  07/15/2011  . .prob

## 2011-07-15 NOTE — Discharge Instructions (Signed)
Take a walk around the block daily. Drink lots of fluids. Stay on a high-fiber, low-fat diet. He may shower UA begin diving her car in one week. No sports or heavy lifting for 4 weeks See Dr. Derrell Lolling in 3 weeks. Call for an appointment

## 2011-07-15 NOTE — Discharge Summary (Signed)
  Patient ID: Nicole Humphrey [id number] 36 y.o. [birth year]  07/04/2011  Discharge date and time: July 15, 2011  Admitting Physician: Ernestene Mention  Discharge Physician: Ernestene Mention  Admission Diagnoses: painful hernias  Discharge Diagnoses: multiple ventral hernias                                         Post operative ileus  Operations: Procedure(s): LAPAROSCOPIC VENTRAL HERNIA INSERTION OF MESH  Admission Condition: good  Discharged Condition: good  Indication for Admission: this is a healthy 36 year old woman who was evaluated last fall during pregnancy for hernias. She had an epigastric hernia which is moderately large and a smaller umbilical hernia which were bulging in her. She has delivered her child. She was reevaluated last month and wanted to have these hernias repaired. Options and techniques were discussed. She wanted to have this done laparoscopically. She is brought to the hospital electively.  Hospital Course:   On the day of admission the patient was taken to the operating room and underwent laparoscopic repair of her multiple ventral hernias. The initial trocar entry into the left upper quadrant revealed a little bit of bleeding around the trocar tip. Thorough exploration revealed a possible serosal tear of the jejunum which was repaired with suture and Tisseel. We found no other injury. Because of the possible GI tract injury contamination we repaired the hernia with a biologic, flex HD mesh and suture fixation and 5 mm protacker.  Postoperatively she had much more pain than I anticipated. She remained hemodynamically stable without fever, tachycardia or change in white count but because of her extreme pain we proceed with CT scanning on postop day 2 which looked normal there is no sign of any complication. I elected to treat her conservatively. She had more pain than expected , more anxiety than expected and more ileus than expected for several days.  She slowly improved with a variety of management techniques for her pain and anxiety and ileus. Eventually she was able to resume a normal diet and resume normal bowel function.  On the day of discharge the patient was happy and felt ready to go home. Abdomen soft and nontender there is no sign of any surgical complication.  She was given instructions in diet and activities. She was given a prescription for hydrocodone for pain. She was told to use MiraLAX as needed intermediate to high fiber low fat diet and to stay well hydrated.  She'll return to see Dr. Derrell Lolling in 3 weeks. ` Consults: None  Significant Diagnostic Studies: CT scan  Treatments: surgery: laparoscopic repair ventral hernia with flex HD mesh  Disposition: Home  Patient Instructions:   [name], [name]  Home Medication Instructions MVH:[id number]   Printed on:07/15/11 0757  Medication Information                    HYDROcodone-acetaminophen (NORCO) 5-325 MG per tablet Take 1-2 tablets by mouth every 4 (four) hours as needed for pain.             Activity: no heavy lifting for 4 weeks Diet: low fat, high fiber diet with emphasis on hydration. Wound Care: as directed  Follow-up:  With Dr. Derrell Lolling in 3 weeks.  Signed: Angelia Mould. Derrell Lolling, M.D., FACS General and minimally invasive surgery Breast and Colorectal Surgery  07/15/2011, 7:57 AM

## 2011-07-17 DIAGNOSIS — B029 Zoster without complications: Secondary | ICD-10-CM

## 2011-07-17 HISTORY — DX: Zoster without complications: B02.9

## 2011-07-20 ENCOUNTER — Encounter (INDEPENDENT_AMBULATORY_CARE_PROVIDER_SITE_OTHER): Payer: 59 | Admitting: General Surgery

## 2011-07-29 ENCOUNTER — Inpatient Hospital Stay (HOSPITAL_COMMUNITY)
Admission: AD | Admit: 2011-07-29 | Discharge: 2011-07-29 | Discharge status: Against Medical Advice | Payer: 59 | Source: Ambulatory Visit | Attending: Obstetrics and Gynecology | Admitting: Obstetrics and Gynecology

## 2011-07-29 ENCOUNTER — Encounter (HOSPITAL_COMMUNITY): Payer: Self-pay | Admitting: *Deleted

## 2011-07-29 ENCOUNTER — Encounter (HOSPITAL_COMMUNITY): Payer: Self-pay | Admitting: Obstetrics and Gynecology

## 2011-07-29 ENCOUNTER — Emergency Department (HOSPITAL_COMMUNITY)
Admission: EM | Admit: 2011-07-29 | Discharge: 2011-07-29 | Disposition: A | Discharge status: Home / Self Care | Payer: 59 | Attending: Emergency Medicine | Admitting: Emergency Medicine

## 2011-07-29 DIAGNOSIS — Z87891 Personal history of nicotine dependence: Secondary | ICD-10-CM | POA: Insufficient documentation

## 2011-07-29 DIAGNOSIS — M549 Dorsalgia, unspecified: Secondary | ICD-10-CM | POA: Insufficient documentation

## 2011-07-29 DIAGNOSIS — B029 Zoster without complications: Secondary | ICD-10-CM | POA: Insufficient documentation

## 2011-07-29 DIAGNOSIS — R21 Rash and other nonspecific skin eruption: Secondary | ICD-10-CM | POA: Insufficient documentation

## 2011-07-29 LAB — URINALYSIS, ROUTINE W REFLEX MICROSCOPIC
Bilirubin Urine: NEGATIVE
Glucose, UA: NEGATIVE mg/dL
Hgb urine dipstick: NEGATIVE
Ketones, ur: NEGATIVE mg/dL
Leukocytes, UA: NEGATIVE
Protein, ur: NEGATIVE mg/dL
pH: 7.5 (ref 5.0–8.0)

## 2011-07-29 MED ORDER — HYDROMORPHONE HCL 2 MG PO TABS: 2.0000 mg | ORAL_TABLET | ORAL | Status: AC | PRN

## 2011-07-29 MED ORDER — HYDROMORPHONE HCL PF 1 MG/ML IJ SOLN
1.0000 mg | Freq: Once | INTRAMUSCULAR | Status: AC
  Administered 2011-07-29: 1 mg via INTRAVENOUS
  Filled 2011-07-29: qty 1

## 2011-07-29 MED ORDER — OXYCODONE-ACETAMINOPHEN 5-325 MG PO TABS
2.0000 | ORAL_TABLET | Freq: Once | ORAL | Status: AC
  Administered 2011-07-29: 2 via ORAL
  Filled 2011-07-29: qty 2

## 2011-07-29 MED ORDER — VALACYCLOVIR HCL 1 G PO TABS: 1000.0000 mg | ORAL_TABLET | Freq: Three times a day (TID) | ORAL | Status: AC

## 2011-07-29 MED ORDER — KETOROLAC TROMETHAMINE 60 MG/2ML IM SOLN
60.0000 mg | Freq: Once | INTRAMUSCULAR | Status: AC
Start: 2011-07-29 — End: 2011-07-29
  Administered 2011-07-29: 60 mg via INTRAMUSCULAR
  Filled 2011-07-29: qty 2

## 2011-07-29 NOTE — MAU Provider Note (Signed)
Nicole Humphrey is a 36 y.o. female who presents to MAU for rash on both legs and lower back pain. She had surgery last month for abdominal hernia repair. She has no GYN issues today.  VS reviewed and are normal Medical screening exam complete and patient is stable to await private provider.

## 2011-07-29 NOTE — ED Notes (Signed)
Pt reports lower bilateral back pain and also rash to hips. Rash is red and raised. Pt reports she was at [facility] but left as she was unwilling to wait any long to be seen. Pt reports she was recently d/ced from the hospital and does not want an IV

## 2011-07-29 NOTE — Discharge Instructions (Signed)
Follow up with your doctor on Monday Shingles Shingles is caused by the same virus that causes chickenpox (varicella zoster virus or VZV). Shingles often occurs many years or decades after having chickenpox. That is why it is more common in adults older than 50 years. The virus reactivates and breaks out as an infection in a nerve root. SYMPTOMS   The initial feeling (sensations) may be pain. This pain is usually described as:   Burning.   Stabbing.   Throbbing.   Tingling in the nerve root.   A red rash will follow in a couple days. The rash may occur in any area of the body and is usually on one side (unilateral) of the body in a band or belt-like pattern. The rash usually starts out as very small blisters (vesicles). They will dry up after 7 to 10 days. This is not usually a significant problem except for the pain it causes.   Long-lasting (chronic) pain is more likely in an elderly person. It can last months to years. This condition is called postherpetic neuralgia.  Shingles can be an extremely severe infection in someone with AIDS, a weakened immune system, or with forms of leukemia. It can also be severe if you are taking transplant medicines or other medicines that weaken the immune system. TREATMENT  Your caregiver will often treat you with:  Antiviral drugs.   Anti-inflammatory drugs.   Pain medicines.  Bed rest is very important in preventing the pain associated with herpes zoster (postherpetic neuralgia). Application of heat in the form of a hot water bottle or electric heating pad or gentle pressure with the hand is recommended to help with the pain or discomfort. PREVENTION  A varicella zoster vaccine is available to help protect against the virus. The Food and Drug Administration approved the varicella zoster vaccine for individuals 59 years of age and older. HOME CARE INSTRUCTIONS   Cool compresses to the area of rash may be helpful.   Only take over-the-counter or  prescription medicines for pain, discomfort, or fever as directed by your caregiver.   Avoid contact with:   Babies.   Pregnant women.   Children with eczema.   Elderly people with transplants.   People with chronic illnesses, such as leukemia and AIDS.   If the area involved is on your face, you may receive a referral for follow-up to a specialist. It is very important to keep all follow-up appointments. This will help avoid eye complications, chronic pain, or disability.  SEEK IMMEDIATE MEDICAL CARE IF:   You develop any pain (headache) in the area of the face or eye. This must be followed carefully by your caregiver or ophthalmologist. An infection in part of your eye (cornea) can be very serious. It could lead to blindness.   You do not have pain relief from prescribed medicines.   Your redness or swelling spreads.   The area involved becomes very swollen and painful.   You have a fever.   You notice any red or painful lines extending away from the affected area toward your heart (lymphangitis).   Your condition is worsening or has changed.  Document Released: 03/27/2005 Document Revised: 03/16/2011 Document Reviewed: 03/01/2009 [facility] Patient Information 2012 [place], [place].

## 2011-07-29 NOTE — MAU Note (Signed)
Nicole Humphrey CNM tied up in L/D. Pt very upset that she has to wait and says she will go to [facility] because she cannot wait any longer due to the pain in her back. Pt left AMA

## 2011-07-29 NOTE — MAU Note (Signed)
Patient reports having umbilical hernia and gastric hernia repair on 3/27 about 3 days ago starting having back pain and rash on both side of her legs.

## 2011-07-29 NOTE — ED Provider Notes (Signed)
History     CSN: [id number]  Arrival date & time 07/29/11  1635   First MD Initiated Contact with Patient 07/29/11 1710      Chief Complaint  Patient presents with  . Back Pain  . Rash    both hips    (Consider location/radiation/quality/duration/timing/severity/associated sxs/prior treatment) Patient is a 36 y.o. female presenting with back pain and rash. The history is provided by the patient.  Back Pain   Rash    patient here with rash to both hips bilaterally x2 days. Rash is described as burning without pruritus. No prior history of same. Patient was concerned that this was from her opiate medications for pain and stopped taking them 2 days ago. She has not used any medications for this prior to arrival. Denies any trouble swallowing or breathing. No other evidence of rash on her body except for the bilateral hips. Denies any prior history of zoster.  Past Medical History  Diagnosis Date  . Hernia   . No pertinent past medical history   . Anemia 2008  . Rash     left fore arm and left scalp     Past Surgical History  Procedure Date  . Cesarean section oct 2012, 2008    x 2  . Scar revision 01/17/2011    Procedure: SCAR REVISION;  Surgeon: Purcell Nails, MD;  Location: [facility];  Service: Gynecology;  Laterality: N/A;  . Ventral hernia repair 07/04/2011    Procedure: LAPAROSCOPIC VENTRAL HERNIA;  Surgeon: Ernestene Mention, MD;  Location: [facility];  Service: General;  Laterality: N/A;  laparoscopic repair ventral hernias with mesh,enterorrhaphapy x1     Family History  Problem Relation Age of Onset  . Heart disease Father     History  Substance Use Topics  . Smoking status: Former Smoker -- 0.2 packs/day for 3 years    Quit date: 04/13/2007  . Smokeless tobacco: Never Used  . Alcohol Use: No    OB History    Grav Para Term Preterm Abortions TAB SAB Ect Mult Living   3 3 3       3       Review of Systems  Musculoskeletal: Positive for back pain.  Skin:  Positive for rash.  All other systems reviewed and are negative.    Allergies  Review of patient's allergies indicates no known allergies.  Home Medications   Current Outpatient Rx  Name Route Sig Dispense Refill  . HYDROCODONE-ACETAMINOPHEN 5-325 MG PO TABS Oral Take 1-2 tablets by mouth every 4 (four) hours as needed. Takes for pain      BP 113/65  Pulse 82  Temp(Src) 98.7 F (37.1 C) (Oral)  Resp 16  Ht 5\' 4"  (1.626 m)  Wt 136 lb (61.689 kg)  BMI 23.34 kg/m2  SpO2 100%  LMP 07/03/2011  Physical Exam  Nursing note and vitals reviewed. Constitutional: She is oriented to person, place, and time. She appears well-developed and well-nourished.  Non-toxic appearance. No distress.  HENT:  Head: Normocephalic and atraumatic.  Eyes: Conjunctivae, EOM and lids are normal. Pupils are equal, round, and reactive to light.  Neck: Normal range of motion. Neck supple. No tracheal deviation present. No mass present.  Cardiovascular: Normal rate, regular rhythm and normal heart sounds.  Exam reveals no gallop.   No murmur heard. Pulmonary/Chest: Effort normal and breath sounds normal. No stridor. No respiratory distress. She has no decreased breath sounds. She has no wheezes. She has no rhonchi. She has  no rales.  Abdominal: Soft. Normal appearance and bowel sounds are normal. She exhibits no distension. There is no tenderness. There is no rebound and no CVA tenderness.  Musculoskeletal: Normal range of motion. She exhibits no edema and no tenderness.  Neurological: She is alert and oriented to person, place, and time. She has normal strength. No cranial nerve deficit or sensory deficit. GCS eye subscore is 4. GCS verbal subscore is 5. GCS motor subscore is 6.  Skin: Skin is warm and dry. No abrasion and no rash noted.       Bilateral hip macular rash with erythema and questionable vesicles, no crepitus  Psychiatric: She has a normal mood and affect. Her speech is normal and behavior is  normal.    ED Course  Procedures (including critical care time)  Labs Reviewed - No data to display No results found.   No diagnosis found.    MDM  Patient given pain medication here and does fall better. Suspect the patient has a zoster and is not having an acute allergic reaction. Will place patient on Valtrex and give pain medication and she will followup with her doctor on Monday for recheck. Patient counseled to avoid pregnant women and people who are immunocompromised        Toy Baker, MD 07/29/11 [phone]

## 2011-07-29 NOTE — ED Notes (Addendum)
Pt from home with reports of lower back pain and rash to sides of bilateral hips that started on Wednesday. Pt also endorses hernia repair on 07/04/11 and being hospitalized for 10 days at [facility] and is having generalized abdominal pain but denies N/V/D or fever. Pt reports that she just left [facility] but was not treated for same.

## 2011-07-29 NOTE — MAU Note (Signed)
Pt presents to MAU with chief complaint of back pain. Pt had surgery March 26- two hernia repairs; pt was in the hospital for 10 days. Pt was taking hydrocodone for pain and says she developed a rash so she stopped taking the pain medication.  Pt says she came to [facility] hospital because she knew she would be seen faster.

## 2011-07-30 LAB — URINE CULTURE
Colony Count: 45000
Culture  Setup Time: 201304202215

## 2011-07-31 ENCOUNTER — Telehealth (INDEPENDENT_AMBULATORY_CARE_PROVIDER_SITE_OTHER): Payer: Self-pay

## 2011-07-31 NOTE — Telephone Encounter (Signed)
Pt called stating she was seen over weekend at ER and DX with shingles on her back. Pt states the pain med they gave her is not strong enough. Pt states she has appt 4-23 with her PCP at [facility] for her shingles treatment. I advised pt to call their office re: pain management for shingles. Pt advised she cannot have 2 MDs prescribing narcotic pain meds. I advised her that since her shingles pain is the primary source of her pain she will need to have PCP manage this. Since she is a month out from surgery and we would be expecting her to decrease her narcotic use for po pain management at this point. Pt states she will follow with her PCP and have them handle her narcotic needs for her shingles pain.

## 2011-08-18 ENCOUNTER — Encounter (INDEPENDENT_AMBULATORY_CARE_PROVIDER_SITE_OTHER): Payer: Self-pay | Admitting: General Surgery

## 2011-08-18 ENCOUNTER — Ambulatory Visit (INDEPENDENT_AMBULATORY_CARE_PROVIDER_SITE_OTHER): Payer: 59 | Admitting: General Surgery

## 2011-08-18 VITALS — BP 98/65 | HR 76 | Temp 98.4°F | Resp 14 | Ht 64.0 in | Wt 137.8 lb

## 2011-08-18 DIAGNOSIS — K439 Ventral hernia without obstruction or gangrene: Secondary | ICD-10-CM

## 2011-08-18 NOTE — Patient Instructions (Signed)
All of the puncture wounds from your ventral hernia repair have healed. There is no sign of any infection or recurrent hernia.  You may resume all normal physical activities. There are no restrictions. Stretch well before and after exercise.  Return to see Dr. Derrell Lolling in 2 months.

## 2011-08-18 NOTE — Progress Notes (Signed)
Subjective:     Patient ID: Nicole Humphrey, female   DOB: 1975/07/23, 36 y.o.   MRN: [id number]  HPI This patient underwent  laparoscopic repair of multiple ventral hernias with biologic mesh July 04, 2011. There was a question of a trocar injury to the proximal small bowel, probably just a serosal injury.  I placed sutures in the small bowel and otherwise repair the hernia with a standard technique.  Her postoperative course was notable for much greater pain and a distress than normal. CT scan and abdominal films showed only colonic ileus and constipation. She remained hospitalized for over a week until she finally resolved her severe pain and eventually she resume diet and bowel function. There was never any evidence of complication.  Since discharge she has returned to work. Her young child is doing well. She still occasionally has some mild pain on the right side but nothing bad. She does not need a refill medication. Bowel function is normal.  Review of Systems     Objective:   Physical Exam Young woman in no distress. Abdomen is soft and nontender. All trocar sites are healing well. No sign of any fluid collection or hernia.    Assessment:     Multiple ventral hernias, status post laparoscopic repair with biologic mesh. Wounds are healing well with good surgical result at this point in time.  Postop abdominal pain out of proportion to the surgery performed. Severe constipation might have been playing a role in this, but that is not certain.  She is now essentially asymptomatic and recovering appropriately.   Plan:     Okay to resume her exercise and normal physical activities without restriction. Stretching before and after exercise is encouraged.  Return to see me in 2 months    Mariadelosang Wynns M. Derrell Lolling, M.D., [facility] General and Minimally invasive Surgery Breast and Colorectal Surgery Office:   [phone] Pager:   [phone]

## 2011-10-08 ENCOUNTER — Encounter (HOSPITAL_COMMUNITY): Payer: Self-pay | Admitting: *Deleted

## 2011-10-08 ENCOUNTER — Emergency Department (HOSPITAL_COMMUNITY)
Admission: EM | Admit: 2011-10-08 | Discharge: 2011-10-08 | Disposition: A | Discharge status: Home / Self Care | Payer: 59 | Attending: Emergency Medicine | Admitting: Emergency Medicine

## 2011-10-08 DIAGNOSIS — D649 Anemia, unspecified: Secondary | ICD-10-CM | POA: Insufficient documentation

## 2011-10-08 DIAGNOSIS — M549 Dorsalgia, unspecified: Secondary | ICD-10-CM | POA: Insufficient documentation

## 2011-10-08 DIAGNOSIS — Z87891 Personal history of nicotine dependence: Secondary | ICD-10-CM | POA: Insufficient documentation

## 2011-10-08 MED ORDER — OXYCODONE-ACETAMINOPHEN 5-325 MG PO TABS: 2.0000 | ORAL_TABLET | ORAL | Status: AC | PRN

## 2011-10-08 MED ORDER — KETOROLAC TROMETHAMINE 60 MG/2ML IM SOLN
60.0000 mg | Freq: Once | INTRAMUSCULAR | Status: AC
  Administered 2011-10-08: 60 mg via INTRAMUSCULAR
  Filled 2011-10-08: qty 2

## 2011-10-08 MED ORDER — IBUPROFEN 600 MG PO TABS: 600.0000 mg | ORAL_TABLET | Freq: Four times a day (QID) | ORAL | Status: AC | PRN

## 2011-10-08 MED ORDER — DIAZEPAM 5 MG PO TABS: 5.0000 mg | ORAL_TABLET | Freq: Two times a day (BID) | ORAL | Status: AC

## 2011-10-08 NOTE — ED Notes (Signed)
Patient is alert and oriented x3  She is complaining of lower back pain that has been a continuing issue  Over the last month.  The pain has became a constant pain that is rated 10 of 10 today. She denies any nausea or vomiting.  Patient has a history of shingles

## 2011-10-08 NOTE — ED Notes (Addendum)
Pain in lower back, had shingles twice  in same place, c/o sever pain in area, but no rash currently,

## 2011-10-08 NOTE — ED Notes (Signed)
MD at bedside. 

## 2011-10-08 NOTE — ED Provider Notes (Signed)
History     CSN: [id number]  Arrival date & time 10/08/11  1207   First MD Initiated Contact with Patient 10/08/11 1358      Chief Complaint  Patient presents with  . Back Pain    (Consider location/radiation/quality/duration/timing/severity/associated sxs/prior treatment) Patient is a 36 y.o. female presenting with back pain. The history is provided by the patient.  Back Pain   pt here with back pain x 3 months--pain localized to lower back and without radiation--no bowel or bladder dysfunction--pain worse movement and better with rest--no urinary sx or recent trauma--using otc meds without reilef  Past Medical History  Diagnosis Date  . Hernia   . No pertinent past medical history   . Anemia 2008  . Rash     left fore arm and left scalp   . Shingles 07/17/2011    Past Surgical History  Procedure Date  . Cesarean section oct 2012, 2008    x 2  . Scar revision 01/17/2011    Procedure: SCAR REVISION;  Surgeon: Purcell Nails, MD;  Location: [facility];  Service: Gynecology;  Laterality: N/A;  . Ventral hernia repair 07/04/2011    Procedure: LAPAROSCOPIC VENTRAL HERNIA;  Surgeon: Ernestene Mention, MD;  Location: [facility];  Service: General;  Laterality: N/A;  laparoscopic repair ventral hernias with mesh,enterorrhaphapy x1     Family History  Problem Relation Age of Onset  . Heart disease Father     History  Substance Use Topics  . Smoking status: Former Smoker -- 0.2 packs/day for 3 years    Quit date: 04/13/2007  . Smokeless tobacco: Never Used  . Alcohol Use: No    OB History    Grav Para Term Preterm Abortions TAB SAB Ect Mult Living   3 3 3       3       Review of Systems  Musculoskeletal: Positive for back pain.  All other systems reviewed and are negative.    Allergies  Review of patient's allergies indicates no known allergies.  Home Medications   Current Outpatient Rx  Name Route Sig Dispense Refill  . BIOTIN 10 MG PO CAPS Oral Take 1 capsule by  mouth daily.    Marland Kitchen HYDROMORPHONE HCL 2 MG PO TABS Oral Take 2 mg by mouth every 4 (four) hours as needed.      BP 107/70  Pulse 72  Temp 98.7 F (37.1 C) (Oral)  Resp 18  Ht 5\' 4"  (1.626 m)  Wt 129 lb 10.1 oz (58.8 kg)  BMI 22.25 kg/m2  SpO2 100%  LMP 09/09/2011  Breastfeeding? No  Physical Exam  Nursing note and vitals reviewed. Constitutional: She is oriented to person, place, and time. Vital signs are normal. She appears well-developed and well-nourished.  Non-toxic appearance. No distress.  HENT:  Head: Normocephalic and atraumatic.  Eyes: Conjunctivae, EOM and lids are normal. Pupils are equal, round, and reactive to light.  Neck: Normal range of motion. Neck supple. No tracheal deviation present. No mass present.  Cardiovascular: Normal rate, regular rhythm and normal heart sounds.  Exam reveals no gallop.   No murmur heard. Pulmonary/Chest: Effort normal and breath sounds normal. No stridor. No respiratory distress. She has no decreased breath sounds. She has no wheezes. She has no rhonchi. She has no rales.  Abdominal: Soft. Normal appearance and bowel sounds are normal. She exhibits no distension. There is no tenderness. There is no rebound and no CVA tenderness.  Musculoskeletal: Normal range of motion. She  exhibits no edema and no tenderness.       Lumbar back: She exhibits pain and spasm.       Back:  Neurological: She is alert and oriented to person, place, and time. She has normal strength. No cranial nerve deficit or sensory deficit. GCS eye subscore is 4. GCS verbal subscore is 5. GCS motor subscore is 6.  Skin: Skin is warm and dry. No abrasion and no rash noted.  Psychiatric: She has a normal mood and affect. Her speech is normal and behavior is normal.    ED Course  Procedures (including critical care time)  Labs Reviewed - No data to display No results found.   No diagnosis found.    MDM  Pt given toradol and will be given rx for  meds        Toy Baker, MD 10/08/11 1414

## 2011-10-08 NOTE — Discharge Instructions (Signed)
Back Pain, Adult Low back pain is very common. About 1 in 5 people have back pain.The cause of low back pain is rarely dangerous. The pain often gets better over time.About half of people with a sudden onset of back pain feel better in just 2 weeks. About 8 in 10 people feel better by 6 weeks.  CAUSES Some common causes of back pain include:  Strain of the muscles or ligaments supporting the spine.   Wear and tear (degeneration) of the spinal discs.   Arthritis.   Direct injury to the back.  DIAGNOSIS Most of the time, the direct cause of low back pain is not known.However, back pain can be treated effectively even when the exact cause of the pain is unknown.Answering your caregiver's questions about your overall health and symptoms is one of the most accurate ways to make sure the cause of your pain is not dangerous. If your caregiver needs more information, he or she may order lab work or imaging tests (X-rays or MRIs).However, even if imaging tests show changes in your back, this usually does not require surgery. HOME CARE INSTRUCTIONS For many people, back pain returns.Since low back pain is rarely dangerous, it is often a condition that people can learn to manageon their own.   Remain active. It is stressful on the back to sit or stand in one place. Do not sit, drive, or stand in one place for more than 30 minutes at a time. Take short walks on level surfaces as soon as pain allows.Try to increase the length of time you walk each day.   Do not stay in bed.Resting more than 1 or 2 days can delay your recovery.   Do not avoid exercise or work.Your body is made to move.It is not dangerous to be active, even though your back may hurt.Your back will likely heal faster if you return to being active before your pain is gone.   Pay attention to your body when you bend and lift. Many people have less discomfortwhen lifting if they bend their knees, keep the load close to their  bodies,and avoid twisting. Often, the most comfortable positions are those that put less stress on your recovering back.   Find a comfortable position to sleep. Use a firm mattress and lie on your side with your knees slightly bent. If you lie on your back, put a pillow under your knees.   Only take over-the-counter or prescription medicines as directed by your caregiver. Over-the-counter medicines to reduce pain and inflammation are often the most helpful.Your caregiver may prescribe muscle relaxant drugs.These medicines help dull your pain so you can more quickly return to your normal activities and healthy exercise.   Put ice on the injured area.   Put ice in a plastic bag.   Place a towel between your skin and the bag.   Leave the ice on for 15 to 20 minutes, 3 to 4 times a day for the first 2 to 3 days. After that, ice and heat may be alternated to reduce pain and spasms.   Ask your caregiver about trying back exercises and gentle massage. This may be of some benefit.   Avoid feeling anxious or stressed.Stress increases muscle tension and can worsen back pain.It is important to recognize when you are anxious or stressed and learn ways to manage it.Exercise is a great option.  SEEK MEDICAL CARE IF:  You have pain that is not relieved with rest or medicine.   You have   pain that does not improve in 1 week.   You have new symptoms.   You are generally not feeling well.  SEEK IMMEDIATE MEDICAL CARE IF:   You have pain that radiates from your back into your legs.   You develop new bowel or bladder control problems.   You have unusual weakness or numbness in your arms or legs.   You develop nausea or vomiting.   You develop abdominal pain.   You feel faint.  Document Released: 03/27/2005 Document Revised: 03/16/2011 Document Reviewed: 08/15/2010 [employer] Patient Information 2012 [employer]. 

## 2011-10-17 ENCOUNTER — Encounter (INDEPENDENT_AMBULATORY_CARE_PROVIDER_SITE_OTHER): Payer: 59 | Admitting: General Surgery

## 2011-10-20 ENCOUNTER — Encounter (INDEPENDENT_AMBULATORY_CARE_PROVIDER_SITE_OTHER): Payer: Self-pay | Admitting: General Surgery

## 2013-01-24 ENCOUNTER — Encounter (INDEPENDENT_AMBULATORY_CARE_PROVIDER_SITE_OTHER): Payer: Self-pay | Admitting: General Surgery

## 2013-01-24 ENCOUNTER — Ambulatory Visit (INDEPENDENT_AMBULATORY_CARE_PROVIDER_SITE_OTHER): Payer: 59 | Admitting: General Surgery

## 2013-01-24 VITALS — BP 110/64 | HR 70 | Temp 97.5°F | Resp 14 | Ht 64.0 in | Wt 122.8 lb

## 2013-01-24 DIAGNOSIS — K432 Incisional hernia without obstruction or gangrene: Secondary | ICD-10-CM | POA: Insufficient documentation

## 2013-01-24 DIAGNOSIS — K439 Ventral hernia without obstruction or gangrene: Secondary | ICD-10-CM

## 2013-01-24 NOTE — Progress Notes (Signed)
Patient ID: Nicole Humphrey, female   DOB: 27-Dec-1975, 37 y.o.   MRN: [id number]  Chief Complaint  Patient presents with  . New Evaluation    EPNP/ eval hernia    HPI Nicole Humphrey is a 37 y.o. female.  She referred herself back to me because she thinks she has a recurrent ventral hernia. She identified as [facility] as her primary care provider.  This patient underwent laparoscopic repair of multiple ventral hernias with biologic mesh July 04, 2011. I found that she had 2 hernias, a smaller hernia at the umbilicus the larger hernia in the epigastrium in the midline about 5 cm above the umbilicus.There was a question of a trocar injury to the proximal small bowel, probably just a serosal injury. I placed sutures in the small bowel and otherwise repaired the hernia with a standard technique.  Her postoperative course was notable for much greater pain and  distress than normal. CT scan and abdominal films showed only colonic ileus and constipation. She remained hospitalized for over a week until she finally resolved her severe pain and eventually she resume diet and bowel function. There was never any evidence of complication, but this was very difficult for her as well as her caregivers.  Since discharge she has returned to work. Her young child is doing well. She gives a three-week history of some pain in the midline above the umbilicus and a bulge. Otherwise she remains healthy, has no other health problems. She remains relatively thin.  HPI  Past Medical History  Diagnosis Date  . Hernia   . No pertinent past medical history   . Anemia 2008  . Rash     left fore arm and left scalp   . Shingles 07/17/2011    Past Surgical History  Procedure Laterality Date  . Cesarean section  oct 2012, 2008    x 2  . Scar revision  01/17/2011    Procedure: SCAR REVISION;  Surgeon: Purcell Nails, MD;  Location: [facility];  Service: Gynecology;  Laterality: N/A;  . Ventral hernia repair  07/04/2011     Procedure: LAPAROSCOPIC VENTRAL HERNIA;  Surgeon: Ernestene Mention, MD;  Location: [facility];  Service: General;  Laterality: N/A;  laparoscopic repair ventral hernias with mesh,enterorrhaphapy x1     Family History  Problem Relation Age of Onset  . Heart disease Father     Social History History  Substance Use Topics  . Smoking status: Former Smoker -- 0.25 packs/day for 3 years    Quit date: 04/13/2007  . Smokeless tobacco: Never Used  . Alcohol Use: Yes    No Known Allergies  No current outpatient prescriptions on file.   No current facility-administered medications for this visit.    Review of Systems Review of Systems  Constitutional: Negative for fever, chills and unexpected weight change.  HENT: Negative for congestion, hearing loss, sore throat, trouble swallowing and voice change.   Eyes: Negative for visual disturbance.  Respiratory: Negative for cough and wheezing.   Cardiovascular: Negative for chest pain, palpitations and leg swelling.  Gastrointestinal: Positive for abdominal pain. Negative for nausea, vomiting, diarrhea, constipation, blood in stool, abdominal distention and anal bleeding.  Genitourinary: Negative for hematuria, vaginal bleeding and difficulty urinating.  Musculoskeletal: Negative for arthralgias.  Skin: Negative for rash and wound.  Neurological: Negative for seizures, syncope and headaches.  Hematological: Negative for adenopathy. Does not bruise/bleed easily.  Psychiatric/Behavioral: Negative for confusion.    Blood pressure 110/64, pulse  70, temperature 97.5 F (36.4 C), temperature source Temporal, resp. rate 14, height 5\' 4"  (1.626 m), weight 122 lb 12.8 oz (55.702 kg).  Physical Exam Physical Exam  Constitutional: She is oriented to person, place, and time. She appears well-developed and well-nourished. No distress.  HENT:  Head: Normocephalic and atraumatic.  Nose: Nose normal.  Mouth/Throat: No oropharyngeal exudate.  Eyes:  Conjunctivae and EOM are normal. Pupils are equal, round, and reactive to light. Left eye exhibits no discharge. No scleral icterus.  Neck: Neck supple. No JVD present. No tracheal deviation present. No thyromegaly present.  Cardiovascular: Normal rate, regular rhythm, normal heart sounds and intact distal pulses.   No murmur heard. Pulmonary/Chest: Effort normal and breath sounds normal. No respiratory distress. She has no wheezes. She has no rales. She exhibits no tenderness.  Abdominal: Soft. Bowel sounds are normal. She exhibits no distension and no mass. There is no tenderness. There is no rebound and no guarding.  Multiple trocar sites well-healed. Small bulge, apparently reducible, in midline a few centimeters above the umbilicus.  Musculoskeletal: She exhibits no edema and no tenderness.  Lymphadenopathy:    She has no cervical adenopathy.  Neurological: She is alert and oriented to person, place, and time. She exhibits normal muscle tone. Coordination normal.  Skin: Skin is warm. No rash noted. She is not diaphoretic. No erythema. No pallor.  Psychiatric: She has a normal mood and affect. Her behavior is normal. Judgment and thought content normal.    Data Reviewed All of my old records.  Assessment    Possible recurrent ventral hernia. This is symptomatic. One possibility is thinning and breakdown of the biologic mesh with recurrence. This has been seen with biologic mesh and other settings.     Plan    Because of the amount of pain she is having she seems motivated to have this further assessed for possible surgical revision  She will be scheduled for CT scan of the abdomen and pelvis with contrast  To see me after the CT scan is done.  Because of the difficulty she she had during the last operation, I would probably approach this repair with an open   technique.        Angelia Mould. Derrell Lolling, M.D., [facility] General and Minimally invasive  Surgery Breast and Colorectal Surgery Office:   [phone] Pager:   [phone]  01/24/2013, 5:57 PM

## 2013-01-24 NOTE — Patient Instructions (Signed)
I agree that it is quite possible that you have a recurrent ventral hernia. It is completely reducible and there is no immediate danger. However, this is painful  We discussed options at this point in time, and we decided to go ahead with a CT scan of the abdomen and pelvis to better define what is going on.  Return to see Dr. Derrell Lolling after the CT scan is done.

## 2013-01-27 ENCOUNTER — Telehealth (INDEPENDENT_AMBULATORY_CARE_PROVIDER_SITE_OTHER): Payer: Self-pay | Admitting: *Deleted

## 2013-01-27 NOTE — Telephone Encounter (Signed)
I spoke with pt to inform her of an appt at [facility] for her CT scan on 01/29/13 with an arrival time of 8:15am.  Instructed pt not to have any solid foods 4 hours prior to the scan.  Also instructed pt to drink 1st bottle of contrast at 6:30am and 2nd bottle at 7:30am.

## 2013-01-29 ENCOUNTER — Ambulatory Visit
Admission: RE | Admit: 2013-01-29 | Discharge: 2013-01-29 | Disposition: A | Discharge status: Home / Self Care | Payer: 59 | Source: Ambulatory Visit | Attending: General Surgery | Admitting: General Surgery

## 2013-01-29 DIAGNOSIS — K432 Incisional hernia without obstruction or gangrene: Secondary | ICD-10-CM

## 2013-01-29 DIAGNOSIS — K439 Ventral hernia without obstruction or gangrene: Secondary | ICD-10-CM

## 2013-01-29 MED ORDER — IOHEXOL 300 MG/ML  SOLN
100.0000 mL | Freq: Once | INTRAMUSCULAR | Status: AC | PRN
  Administered 2013-01-29: 100 mL via INTRAVENOUS

## 2013-02-18 ENCOUNTER — Encounter (INDEPENDENT_AMBULATORY_CARE_PROVIDER_SITE_OTHER): Payer: Self-pay | Admitting: General Surgery

## 2013-02-18 ENCOUNTER — Telehealth (INDEPENDENT_AMBULATORY_CARE_PROVIDER_SITE_OTHER): Payer: Self-pay | Admitting: General Surgery

## 2013-02-18 ENCOUNTER — Ambulatory Visit (INDEPENDENT_AMBULATORY_CARE_PROVIDER_SITE_OTHER): Payer: 59 | Admitting: General Surgery

## 2013-02-18 VITALS — BP 108/64 | HR 66 | Temp 98.4°F | Resp 14 | Ht 64.0 in | Wt 122.4 lb

## 2013-02-18 DIAGNOSIS — K432 Incisional hernia without obstruction or gangrene: Secondary | ICD-10-CM

## 2013-02-18 NOTE — Patient Instructions (Signed)
Your CT scan shows a small recurrent incisional hernia. The opening is fairly small and it contains only fat.  Because this is causing pain, you state  you would like to go ahead and have this repaired.  You will be scheduled for open repair of recurrent ventral hernia with mesh in the near future at [facility].  Her CT scan also shows an incidental finding. There is a 5 mm lung nodule. Dr. Derrell Lolling recommends that you get another CT scan of your chest in 12 months and review this with the physicians and nurse practitioner at [place] [facility].

## 2013-02-18 NOTE — Progress Notes (Signed)
Patient ID: Nicole Humphrey, female   DOB: Jan 13, 1976, 37 y.o.   MRN: [id number]  Chief Complaint  Patient presents with  . Follow-up    LTFU/ f/u after CT    HPI Nicole Humphrey is a 37 y.o. female.  She returns following CT scanning to discuss her recurrent ventral hernia  She states that the hernia wall above her umbilicus it is reducible but it causes pain when she is standing or doing any active activities. No nausea or vomiting.  Her CT scan shows a small ventral hernia in the midline above the umbilicus containing adipose tissue. The neck of the hernia is 10 mm. This appears to be coming directly through the center of the [facility] biologic mesh. The CT scan also shows a 5 mm right lower lobe pulmonary nodule. I discussed this with the patient. She does not smoke. She does not have any obvious environmental exposure. Her parents did not smoke.   Significant history is as follows: This patient underwent laparoscopic repair of multiple ventral hernias with biologic mesh July 04, 2011. I found that she had 2 hernias, a smaller hernia at the umbilicus the larger hernia in the epigastrium in the midline about 5 cm above the umbilicus.There was a question of a trocar injury to the proximal small bowel, probably just a serosal injury. I placed sutures in the small bowel and otherwise repaired the hernia with a standard technique.  Her postoperative course was notable for much greater pain and distress than normal. CT scan and abdominal films showed only colonic ileus and constipation. She remained hospitalized for over a week until she finally resolved her severe pain and eventually she resume diet and bowel function. There was never any evidence of complication, but this was very difficult for her as well as her caregivers.  Since discharge she has returned to work. Her young child is doing well.  She remains healthy and thin. She has had 3 cesarean sections. No prior history of any gynecologic  problems.    HPI  Past Medical History  Diagnosis Date  . Hernia   . No pertinent past medical history   . Anemia 2008  . Rash     left fore arm and left scalp   . Shingles 07/17/2011    Past Surgical History  Procedure Laterality Date  . Cesarean section  oct 2012, 2008    x 2  . Scar revision  01/17/2011    Procedure: SCAR REVISION;  Surgeon: Purcell Nails, MD;  Location: [facility];  Service: Gynecology;  Laterality: N/A;  . Ventral hernia repair  07/04/2011    Procedure: LAPAROSCOPIC VENTRAL HERNIA;  Surgeon: Ernestene Mention, MD;  Location: [facility];  Service: General;  Laterality: N/A;  laparoscopic repair ventral hernias with mesh,enterorrhaphapy x1     Family History  Problem Relation Age of Onset  . Heart disease Father     Social History History  Substance Use Topics  . Smoking status: Former Smoker -- 0.25 packs/day for 3 years    Quit date: 04/13/2007  . Smokeless tobacco: Never Used  . Alcohol Use: Yes    No Known Allergies  No current outpatient prescriptions on file.   No current facility-administered medications for this visit.    Review of Systems Review of Systems  Constitutional: Negative for fever, chills and unexpected weight change.  HENT: Negative for congestion, hearing loss, sore throat, trouble swallowing and voice change.   Eyes: Negative for visual disturbance.  Respiratory: Negative for cough and wheezing.   Cardiovascular: Negative for chest pain, palpitations and leg swelling.  Gastrointestinal: Negative for nausea, vomiting, abdominal pain, diarrhea, constipation, blood in stool, abdominal distention and anal bleeding.  Genitourinary: Negative for hematuria, vaginal bleeding and difficulty urinating.  Musculoskeletal: Negative for arthralgias.  Skin: Negative for rash and wound.  Neurological: Negative for seizures, syncope and headaches.  Hematological: Negative for adenopathy. Does not bruise/bleed easily.   Psychiatric/Behavioral: Negative for confusion.    Blood pressure 108/64, pulse 66, temperature 98.4 F (36.9 C), temperature source Temporal, resp. rate 14, height 5\' 4"  (1.626 m), weight 122 lb 6.4 oz (55.52 kg).  Physical Exam Physical Exam  Constitutional: She is oriented to person, place, and time. She appears well-developed and well-nourished. No distress.  HENT:  Head: Normocephalic and atraumatic.  Nose: Nose normal.  Mouth/Throat: No oropharyngeal exudate.  Eyes: Conjunctivae and EOM are normal. Pupils are equal, round, and reactive to light. Left eye exhibits no discharge. No scleral icterus.  Neck: Neck supple. No JVD present. No tracheal deviation present. No thyromegaly present.  Cardiovascular: Normal rate, regular rhythm, normal heart sounds and intact distal pulses.   No murmur heard. Pulmonary/Chest: Effort normal and breath sounds normal. No respiratory distress. She has no wheezes. She has no rales. She exhibits no tenderness.  Abdominal: Soft. Bowel sounds are normal. She exhibits no distension and no mass. There is no tenderness. There is no rebound and no guarding.  Multiple trocar sites well healed. Small bulge, reducible, in midline about 3 cm above the umbilicus. No other defects noted. Edge of the hernia is vague.  Musculoskeletal: She exhibits no edema and no tenderness.  Lymphadenopathy:    She has no cervical adenopathy.  Neurological: She is alert and oriented to person, place, and time. She exhibits normal muscle tone. Coordination normal.  Skin: Skin is warm. No rash noted. She is not diaphoretic. No erythema. No pallor.  Psychiatric: She has a normal mood and affect. Her behavior is normal. Judgment and thought content normal.    Data Reviewed CT scan  Assessment    Recurrent incisional hernia. Small defect containing fat only. I suspect either complete or partial thinning and eventration of the status biologic mesh. This is likely to progress. She  is symptomatic and requests repair  5 mm right lower lobe pulmonary nodule of uncertain significance. No risk factors. I discussed this with her and gave her a copy of the report. I told her that she should discuss this with her PCP and repeat CT scan in one year discussed that with him as well to make sure this does not enlarged  Status post C-section x3     Plan    Scheduled for open repair of recurrent incisional hernia with mesh. I told her we would approach this under general anesthesia with the midline incision. I told her this might be a relatively small incision or  a relatively large incision  depending on the findings. This will probably be the safest approach. I might even consider onlay rather than inlaying mesh to stay out of the abdomen because of her prior problems.  I discussed the indications, details, techniques, numerous risk of the surgery with her. I did this in great detail because of the problems she had before. She is aware of the risk of bleeding, infection, hernia recurrence, injury to the intestines, and other unforeseen problems. All of her questions are answered and she agrees with this plan.  Angelia Mould. Derrell Lolling, M.D., [facility] General and Minimally invasive Surgery Breast and Colorectal Surgery Office:   [phone] Pager:   [phone]  02/18/2013, 4:15 PM

## 2013-02-18 NOTE — Telephone Encounter (Signed)
Patient met with surgery scheduling, went over financial responsibilities, wants to wait until January 2015, place in January folder to be scheduled

## 2013-03-10 ENCOUNTER — Telehealth (INDEPENDENT_AMBULATORY_CARE_PROVIDER_SITE_OTHER): Payer: Self-pay | Admitting: General Surgery

## 2013-03-10 NOTE — Telephone Encounter (Signed)
Pt made aware of [facility] financial obligation will call back if and when available

## 2014-02-09 ENCOUNTER — Encounter (INDEPENDENT_AMBULATORY_CARE_PROVIDER_SITE_OTHER): Payer: Self-pay | Admitting: General Surgery

## 2017-09-16 ENCOUNTER — Emergency Department (HOSPITAL_COMMUNITY)
Admission: EM | Admit: 2017-09-16 | Discharge: 2017-09-16 | Disposition: A | Discharge status: Home / Self Care | Payer: 59 | Attending: Emergency Medicine | Admitting: Emergency Medicine

## 2017-09-16 ENCOUNTER — Encounter (HOSPITAL_COMMUNITY): Payer: Self-pay | Admitting: Emergency Medicine

## 2017-09-16 ENCOUNTER — Emergency Department (HOSPITAL_COMMUNITY): Payer: 59

## 2017-09-16 DIAGNOSIS — R51 Headache: Secondary | ICD-10-CM | POA: Insufficient documentation

## 2017-09-16 DIAGNOSIS — Z87891 Personal history of nicotine dependence: Secondary | ICD-10-CM | POA: Diagnosis not present

## 2017-09-16 DIAGNOSIS — R519 Headache, unspecified: Secondary | ICD-10-CM

## 2017-09-16 LAB — POC URINE PREG, ED: Preg Test, Ur: NEGATIVE

## 2017-09-16 MED ORDER — KETOROLAC TROMETHAMINE 15 MG/ML IJ SOLN
30.0000 mg | Freq: Once | INTRAMUSCULAR | Status: AC
  Administered 2017-09-16: 30 mg via INTRAMUSCULAR
  Filled 2017-09-16: qty 2

## 2017-09-16 MED ORDER — ACETAMINOPHEN 500 MG PO TABS
1000.0000 mg | ORAL_TABLET | Freq: Once | ORAL | Status: AC
  Administered 2017-09-16: 1000 mg via ORAL
  Filled 2017-09-16: qty 2

## 2017-09-16 MED ORDER — DIPHENHYDRAMINE HCL 50 MG/ML IJ SOLN
25.0000 mg | Freq: Once | INTRAMUSCULAR | Status: AC
Start: 2017-09-16 — End: 2017-09-16
  Administered 2017-09-16: 25 mg via INTRAMUSCULAR
  Filled 2017-09-16: qty 1

## 2017-09-16 NOTE — ED Provider Notes (Signed)
[facility]-EMERGENCY DEPT Provider Note   CSN: [id number] Arrival date & time: 09/16/17  1012     History   Chief Complaint Chief Complaint  Patient presents with  . Headache  . Blurred Vision    HPI Nicole Humphrey is a 42 y.o. female.  43 year old female with prior history of anemia and abdominal hernia presents for evaluation of headache.  Patient reports that this is not a her normal headache.  She describes pain to the top of her head.  That pain is been present for the last week or so.  She reports that the pain today moved from the top of her head to around her left eye.  She denies to this examiner any change in her visual acuity.  She does report pain to the top of the head and then into the left nipple and into behind the left eye.  She did not take anything for her symptoms today.  She has not tried anything at home for symptom medic relief.  She denies associated fever.  She denies any nausea or vomiting.  She denies neck pain.  She denies focal weakness.  She appears to be mildly anxious upon my evaluation.  The history is provided by the patient.  Headache   This is a new problem. The current episode started more than 1 week ago. The problem occurs constantly. The problem has not changed since onset.The headache is associated with nothing. The pain is located in the left unilateral region. The quality of the pain is described as sharp. The pain is mild. The pain radiates to the face. Pertinent negatives include no anorexia, no fever, no chest pressure, no near-syncope, no shortness of breath, no nausea and no vomiting. She has tried nothing for the symptoms.    Past Medical History:  Diagnosis Date  . Anemia 2008  . Hernia   . No pertinent past medical history   . Rash    left fore arm and left scalp   . Shingles 07/17/2011    Patient Active Problem List   Diagnosis Date Noted  . Recurrent ventral hernia 01/24/2013  . Ventral hernia 07/09/2011    . Term birth of newborn 01/20/2011    Past Surgical History:  Procedure Laterality Date  . CESAREAN SECTION  oct 2012, 2008   x 2  . SCAR REVISION  01/17/2011   Procedure: SCAR REVISION;  Surgeon: Purcell Nails, MD;  Location: [facility];  Service: Gynecology;  Laterality: N/A;  . VENTRAL HERNIA REPAIR  07/04/2011   Procedure: LAPAROSCOPIC VENTRAL HERNIA;  Surgeon: Ernestene Mention, MD;  Location: [facility];  Service: General;  Laterality: N/A;  laparoscopic repair ventral hernias with mesh,enterorrhaphapy x1      OB History    Gravida  3   Para  3   Term  3   Preterm      AB      Living  3     SAB      TAB      Ectopic      Multiple      Live Births  1            Home Medications    Prior to Admission medications   Not on File    Family History Family History  Problem Relation Age of Onset  . Heart disease Father     Social History Social History   Tobacco Use  . Smoking status: Former Smoker  Packs/day: 0.25    Years: 3.00    Pack years: 0.75    Last attempt to quit: 04/13/2007    Years since quitting: 10.4  . Smokeless tobacco: Never Used  Substance Use Topics  . Alcohol use: Yes  . Drug use: No     Allergies   Patient has no known allergies.   Review of Systems Review of Systems  Constitutional: Negative for fever.  Respiratory: Negative for shortness of breath.   Cardiovascular: Negative for near-syncope.  Gastrointestinal: Negative for anorexia, nausea and vomiting.  Neurological: Positive for headaches.  All other systems reviewed and are negative.    Physical Exam Updated Vital Signs BP (!) 124/94 (BP Location: Left Arm)   Pulse (!) 125   Temp 98.9 F (37.2 C) (Oral)   Resp 18   Ht 5\' 4"  (1.626 m)   Wt 50.8 kg (112 lb)   LMP 09/10/2017   SpO2 100%   BMI 19.22 kg/m   Physical Exam  Constitutional: She is oriented to person, place, and time. She appears well-developed and well-nourished. No distress.  HENT:   Head: Normocephalic and atraumatic.  Mouth/Throat: Oropharynx is clear and moist.  Eyes: Pupils are equal, round, and reactive to light. Conjunctivae and EOM are normal.  Neck: Normal range of motion. Neck supple.  Cardiovascular: Normal rate, regular rhythm and normal heart sounds.  Pulmonary/Chest: Effort normal and breath sounds normal. No respiratory distress.  Abdominal: Soft. She exhibits no distension. There is no tenderness.  Musculoskeletal: Normal range of motion. She exhibits no edema or deformity.  Neurological: She is alert and oriented to person, place, and time.  Skin: Skin is warm and dry.  Psychiatric: She has a normal mood and affect.  Nursing note and vitals reviewed.    ED Treatments / Results  Labs (all labs ordered are listed, but only abnormal results are displayed) Labs Reviewed  POC URINE PREG, ED    EKG None  Radiology Ct Head Wo Contrast  Result Date: 09/16/2017 CLINICAL DATA:  Severe headache for several weeks.  Poor vision. EXAM: CT HEAD WITHOUT CONTRAST TECHNIQUE: Contiguous axial images were obtained from the base of the skull through the vertex without intravenous contrast. COMPARISON:  None. FINDINGS: Brain: No evidence of acute infarction, hemorrhage, hydrocephalus, extra-axial collection, or mass lesion/mass effect. Vascular:  No hyperdense vessel or other acute findings. Skull: No evidence of fracture or other significant bone abnormality. Sinuses/Orbits:  No acute findings. Other: None. IMPRESSION: Negative noncontrast head CT. Electronically Signed   By: Myles RosenthalJohn  Stahl M.D.   On: 09/16/2017 11:10    Procedures Procedures (including critical care time)  Medications Ordered in ED Medications  acetaminophen (TYLENOL) tablet 1,000 mg (has no administration in time range)  ketorolac (TORADOL) 15 MG/ML injection 30 mg (30 mg Intramuscular Given 09/16/17 1119)  diphenhydrAMINE (BENADRYL) injection 25 mg (25 mg Intramuscular Given 09/16/17 1118)      Initial Impression / Assessment and Plan / ED Course  I have reviewed the triage vital signs and the nursing notes.  Pertinent labs & imaging results that were available during my care of the patient were reviewed by me and considered in my medical decision making (see chart for details).     MDM  Screen Complete  Patient is presenting for evaluation of reported headache.  History and exam are not suggestive of significant acute pathology.  However, given patient's report that this is an unusual headache for her CT brain was obtained.  CT does not  demonstrate any acute pathology.  Patient does feel improved following administration of Toradol and Benadryl in the ED.  She now desires discharge home.  Importance of close follow-up is stressed.  Strict return precautions were given and understood.   Final Clinical Impressions(s) / ED Diagnoses   Final diagnoses:  Nonintractable headache, unspecified chronicity pattern, unspecified headache type    ED Discharge Orders    None       Wynetta Fines, MD 09/16/17 1257

## 2017-09-16 NOTE — ED Triage Notes (Signed)
Pt c/o headache for couple weeks that has been intermittent. Reports today woke up with left eye blurred vision.

## 2017-09-16 NOTE — Discharge Instructions (Addendum)
Please return for any problem.  Follow-up with your regular doctor within the next week as instructed.

## 2017-12-10 ENCOUNTER — Emergency Department (HOSPITAL_COMMUNITY)
Admission: EM | Admit: 2017-12-10 | Discharge: 2017-12-10 | Disposition: A | Discharge status: Home / Self Care | Payer: 59 | Attending: Emergency Medicine | Admitting: Emergency Medicine

## 2017-12-10 DIAGNOSIS — T5491XA Toxic effect of unspecified corrosive substance, accidental (unintentional), initial encounter: Secondary | ICD-10-CM | POA: Insufficient documentation

## 2017-12-10 DIAGNOSIS — R079 Chest pain, unspecified: Secondary | ICD-10-CM | POA: Insufficient documentation

## 2017-12-10 DIAGNOSIS — R109 Unspecified abdominal pain: Secondary | ICD-10-CM | POA: Insufficient documentation

## 2017-12-10 DIAGNOSIS — Z87891 Personal history of nicotine dependence: Secondary | ICD-10-CM | POA: Insufficient documentation

## 2017-12-10 DIAGNOSIS — R110 Nausea: Secondary | ICD-10-CM | POA: Insufficient documentation

## 2017-12-10 MED ORDER — FAMOTIDINE 40 MG PO TABS: 40.0000 mg | ORAL_TABLET | Freq: Every day | ORAL | 0 refills | Status: AC

## 2017-12-10 MED ORDER — GI COCKTAIL ~~LOC~~
30.0000 mL | Freq: Once | ORAL | Status: AC
  Administered 2017-12-10: 30 mL via ORAL
  Filled 2017-12-10: qty 30

## 2017-12-10 NOTE — ED Provider Notes (Signed)
[facility] EMERGENCY DEPARTMENT Provider Note   CSN: [id number] Arrival date & time: 12/10/17  1612     History   Chief Complaint Chief Complaint  Patient presents with  . Ingestion    HPI Nicole Humphrey is a 42 y.o. female with a history of anemia who presents to the emergency department by EMS with a chief complaint of ingestion.  The patient reports that she was cleaning her bathroom and had approximately 6 ounces of bleach in a cup when she accidentally drank it.  Incident occurred immediately prior to arrival.  She states that she thought she was reaching for a glass of water, and after she did not realize she had reached for the wrong glass until she had already started swallowing.  She reports associated 8 out of 10 burning pain to the mid chest and abdomen and nausea.  She denies vomiting, blistering to the mouth or throat, drooling, or swelling of the neck.  States that she drank it unintentionally.  She denies SI, HI, or auditory visual hallucinations.  No history of similar.  No treatment prior to arrival.  The history is provided by the patient. No language interpreter was used.    Past Medical History:  Diagnosis Date  . Anemia 2008  . Hernia   . No pertinent past medical history   . Rash    left fore arm and left scalp   . Shingles 07/17/2011    Patient Active Problem List   Diagnosis Date Noted  . Recurrent ventral hernia 01/24/2013  . Ventral hernia 07/09/2011  . Term birth of newborn 01/20/2011    Past Surgical History:  Procedure Laterality Date  . CESAREAN SECTION  oct 2012, 2008   x 2  . SCAR REVISION  01/17/2011   Procedure: SCAR REVISION;  Surgeon: Purcell Nails, MD;  Location: [facility];  Service: Gynecology;  Laterality: N/A;  . VENTRAL HERNIA REPAIR  07/04/2011   Procedure: LAPAROSCOPIC VENTRAL HERNIA;  Surgeon: Ernestene Mention, MD;  Location: [facility];  Service: General;  Laterality: N/A;  laparoscopic repair ventral hernias  with mesh,enterorrhaphapy x1      OB History    Gravida  3   Para  3   Term  3   Preterm      AB      Living  3     SAB      TAB      Ectopic      Multiple      Live Births  1            Home Medications    Prior to Admission medications   Medication Sig Start Date End Date Taking? Authorizing Provider  famotidine (PEPCID) 40 MG tablet Take 1 tablet (40 mg total) by mouth daily for 14 days. 12/10/17 12/24/17  Yeraldi Fidler, Coral Else, PA-C    Family History Family History  Problem Relation Age of Onset  . Heart disease Father     Social History Social History   Tobacco Use  . Smoking status: Former Smoker    Packs/day: 0.25    Years: 3.00    Pack years: 0.75    Last attempt to quit: 04/13/2007    Years since quitting: 10.6  . Smokeless tobacco: Never Used  Substance Use Topics  . Alcohol use: Yes  . Drug use: No     Allergies   Patient has no known allergies.   Review of Systems Review of  Systems  Constitutional: Negative for activity change, chills and fever.  HENT: Negative for congestion, drooling, facial swelling, mouth sores and trouble swallowing.   Respiratory: Negative for shortness of breath.   Cardiovascular: Positive for chest pain.  Gastrointestinal: Positive for abdominal pain and nausea. Negative for vomiting.  Genitourinary: Negative for dysuria.  Musculoskeletal: Negative for back pain.  Skin: Negative for rash.  Allergic/Immunologic: Negative for immunocompromised state.  Neurological: Negative for weakness and headaches.  Psychiatric/Behavioral: Negative for confusion.     Physical Exam Updated Vital Signs BP 115/76   Pulse 86   Temp 99.4 F (37.4 C) (Oral)   Resp 16   Ht 5\' 3"  (1.6 m)   Wt 52.2 kg   LMP 11/18/2017 (Approximate)   SpO2 99%   BMI 20.37 kg/m   Physical Exam  Constitutional: No distress.  HENT:  Head: Normocephalic.  Posterior oropharynx is unremarkable.  No ulcerations noted to the tongue or  intraoral mucosa.  Eyes: Conjunctivae are normal.  Neck: Neck supple.  Cardiovascular: Normal rate, regular rhythm, normal heart sounds and intact distal pulses. Exam reveals no gallop and no friction rub.  No murmur heard. Pulmonary/Chest: Effort normal. No stridor. No respiratory distress. She has no wheezes. She has no rales. She exhibits no tenderness.  Speaks in complete, fluent sentences.  Abdominal: Soft. Bowel sounds are normal. She exhibits no distension and no mass. There is no tenderness. There is no rebound and no guarding. No hernia.  Neurological: She is alert.  Skin: Skin is warm. No rash noted.  Psychiatric: Her behavior is normal.  Anxious appearing.   Nursing note and vitals reviewed.    ED Treatments / Results  Labs (all labs ordered are listed, but only abnormal results are displayed) Labs Reviewed - No data to display  EKG None  Radiology No results found.  Procedures Procedures (including critical care time)  Medications Ordered in ED Medications  gi cocktail (Maalox,Lidocaine,Donnatal) (30 mLs Oral Given 12/10/17 1831)     Initial Impression / Assessment and Plan / ED Course  I have reviewed the triage vital signs and the nursing notes.  Pertinent labs & imaging results that were available during my care of the patient were reviewed by me and considered in my medical decision making (see chart for details).     42 year old female with a history of anemia who presents to the emergency department by EMS from home after an unintentional bleach ingestion.  Spoke with poison control who recommended having the patient swish and spit out water.  They also recommended keeping the patient n.p.o. for 1 hour while observing her for any drooling, vomiting, edema, or blistering of the oropharynx.  After she was n.p.o. for 1 hour, they recommended doing a swallow test.  They recommended if she was able to complete the swallow test then she could be medically cleared  at that point, but if she develops symptoms while she was n.p.o. or fail the swallow test then GI should be consulted.  On exam, the patient has no signs or symptoms of caustic injury.  Patient was tolerating water after being n.p.o.  She continued to endorse some mild epigastric discomfort.  GI cocktail given with significant improvement in her symptoms.  She is continued to tolerate liquids without difficulty.  She is hemodynamically stable and in no acute distress.  She is requesting medication for epigastric discomfort for home use.  Will provide her with a trial of Pepcid she did not enjoy the viscous lidocaine.  She has been given strict return precautions to the emergency department.  At this time, she is hemodynamically stable and safe for discharge to home with outpatient follow-up.    Final Clinical Impressions(s) / ED Diagnoses   Final diagnoses:  Ingestion of bleach, accidental or unintentional, initial encounter    ED Discharge Orders         Ordered    famotidine (PEPCID) 40 MG tablet  Daily     12/10/17 1928           Niasia Lanphear, Coral Else, PA-C 12/11/17 0013    Linwood Dibbles, MD 12/12/17 2215

## 2017-12-10 NOTE — Discharge Instructions (Signed)
Thank you for allowing me to care for you today in the Emergency Department.   Take 1 tablet of Pepcid daily for the next 2 weeks.  You can follow-up with your primary care provider if you continue to have an upset stomach or discomfort for the next couple of days.  Return to the emergency department if you develop blisters in your mouth or throat, persistent vomiting, or other new, concerning symptoms.

## 2017-12-10 NOTE — ED Notes (Signed)
Follow up with poison control; updated on pt's status. Will continue to monitor.

## 2017-12-10 NOTE — ED Notes (Signed)
ED Provider at bedside. 

## 2017-12-10 NOTE — ED Notes (Signed)
RN notified poison control of pt ingestion/situation. Poison control recommended PO challenge. Poison control did not recommend any specific labs/monitoring.

## 2017-12-10 NOTE — ED Notes (Signed)
Pt tolerating water at this time. States her throat does not burn, but the water still tastes like bleach as it goes down. Patient ambulatory to bathroom with steady gait at this time.

## 2017-12-10 NOTE — ED Triage Notes (Addendum)
Per [facility], pt coming from home after unintentionally ingesting approximately 6 oz of regular bleach. Pt reports she was cleaning bathroom, had bleach in a cup, and accidentally drank it. Pt denies any thoughts of harming self to this RN. Pt endorses 8/10 burning sensation to chest and abdomen. EMS reports 12 lead unremarkable. Pt alert, oriented, and ambulatory on arrival.

## 2019-11-14 ENCOUNTER — Encounter (HOSPITAL_COMMUNITY): Payer: Self-pay

## 2019-11-14 ENCOUNTER — Emergency Department (HOSPITAL_COMMUNITY)
Admission: EM | Admit: 2019-11-14 | Discharge: 2019-11-14 | Disposition: A | Discharge status: Home / Self Care | Payer: HRSA Program | Attending: Emergency Medicine | Admitting: Emergency Medicine

## 2019-11-14 ENCOUNTER — Other Ambulatory Visit: Payer: Self-pay

## 2019-11-14 ENCOUNTER — Emergency Department (HOSPITAL_COMMUNITY): Payer: HRSA Program

## 2019-11-14 DIAGNOSIS — R102 Pelvic and perineal pain: Secondary | ICD-10-CM | POA: Insufficient documentation

## 2019-11-14 DIAGNOSIS — R531 Weakness: Secondary | ICD-10-CM | POA: Diagnosis not present

## 2019-11-14 DIAGNOSIS — R112 Nausea with vomiting, unspecified: Secondary | ICD-10-CM | POA: Insufficient documentation

## 2019-11-14 DIAGNOSIS — U071 COVID-19: Secondary | ICD-10-CM | POA: Insufficient documentation

## 2019-11-14 DIAGNOSIS — R05 Cough: Secondary | ICD-10-CM | POA: Insufficient documentation

## 2019-11-14 DIAGNOSIS — E860 Dehydration: Secondary | ICD-10-CM | POA: Insufficient documentation

## 2019-11-14 DIAGNOSIS — M791 Myalgia, unspecified site: Secondary | ICD-10-CM | POA: Diagnosis not present

## 2019-11-14 DIAGNOSIS — Z87891 Personal history of nicotine dependence: Secondary | ICD-10-CM | POA: Diagnosis not present

## 2019-11-14 DIAGNOSIS — R509 Fever, unspecified: Secondary | ICD-10-CM | POA: Diagnosis present

## 2019-11-14 DIAGNOSIS — R0602 Shortness of breath: Secondary | ICD-10-CM | POA: Insufficient documentation

## 2019-11-14 DIAGNOSIS — R197 Diarrhea, unspecified: Secondary | ICD-10-CM | POA: Diagnosis not present

## 2019-11-14 LAB — I-STAT BETA HCG BLOOD, ED (MC, WL, AP ONLY): I-stat hCG, quantitative: <5 m[IU]/mL (ref <5)

## 2019-11-14 LAB — URINALYSIS, ROUTINE W REFLEX MICROSCOPIC
Bilirubin Urine: NEGATIVE
Glucose, UA: NEGATIVE mg/dL
Hgb urine dipstick: NEGATIVE
Ketones, ur: 80 mg/dL — AB
Leukocytes,Ua: NEGATIVE
Nitrite: NEGATIVE
Protein, ur: 100 mg/dL — AB
Specific Gravity, Urine: 1.034 — ABNORMAL HIGH (ref 1.005–1.030)
pH: 5 (ref 5.0–8.0)

## 2019-11-14 LAB — CBC WITH DIFFERENTIAL/PLATELET
Abs Immature Granulocytes: 0 10*3/uL (ref 0.00–0.07)
Basophils Absolute: 0 10*3/uL (ref 0.0–0.1)
Basophils Relative: 1 %
Eosinophils Absolute: 0 10*3/uL (ref 0.0–0.5)
Eosinophils Relative: 0 %
HCT: 29.5 % — ABNORMAL LOW (ref 36.0–46.0)
Hemoglobin: 8.7 g/dL — ABNORMAL LOW (ref 12.0–15.0)
Immature Granulocytes: 0 %
Lymphocytes Relative: 32 %
Lymphs Abs: 1.3 10*3/uL (ref 0.7–4.0)
MCH: 18.8 pg — ABNORMAL LOW (ref 26.0–34.0)
MCHC: 29.5 g/dL — ABNORMAL LOW (ref 30.0–36.0)
MCV: 63.7 fL — ABNORMAL LOW (ref 80.0–100.0)
Monocytes Absolute: 0.3 10*3/uL (ref 0.1–1.0)
Monocytes Relative: 8 %
Neutro Abs: 2.4 10*3/uL (ref 1.7–7.7)
Neutrophils Relative %: 59 %
Platelets: 227 10*3/uL (ref 150–400)
RBC: 4.63 MIL/uL (ref 3.87–5.11)
RDW: 20.4 % — ABNORMAL HIGH (ref 11.5–15.5)
WBC: 4 10*3/uL (ref 4.0–10.5)
nRBC: 0 % (ref 0.0–0.2)

## 2019-11-14 LAB — COMPREHENSIVE METABOLIC PANEL
ALT: 14 U/L (ref 0–44)
AST: 29 U/L (ref 15–41)
Albumin: 4.3 g/dL (ref 3.5–5.0)
Alkaline Phosphatase: 56 U/L (ref 38–126)
Anion gap: 10 (ref 5–15)
BUN: 9 mg/dL (ref 6–20)
CO2: 25 mmol/L (ref 22–32)
Calcium: 8.6 mg/dL — ABNORMAL LOW (ref 8.9–10.3)
Chloride: 99 mmol/L (ref 98–111)
Creatinine, Ser: 0.59 mg/dL (ref 0.44–1.00)
GFR calc Af Amer: >60 mL/min (ref >60)
GFR calc non Af Amer: >60 mL/min (ref >60)
Glucose, Bld: 82 mg/dL (ref 70–99)
Potassium: 3.5 mmol/L (ref 3.5–5.1)
Sodium: 134 mmol/L — ABNORMAL LOW (ref 135–145)
Total Bilirubin: 0.7 mg/dL (ref 0.3–1.2)
Total Protein: 8 g/dL (ref 6.5–8.1)

## 2019-11-14 LAB — SARS CORONAVIRUS 2 BY RT PCR (HOSPITAL ORDER, PERFORMED IN ~~LOC~~ HOSPITAL LAB): SARS Coronavirus 2: POSITIVE — AB

## 2019-11-14 LAB — LIPASE, BLOOD: Lipase: 47 U/L (ref 11–51)

## 2019-11-14 MED ORDER — FENTANYL CITRATE (PF) 100 MCG/2ML IJ SOLN
50.0000 ug | Freq: Once | INTRAMUSCULAR | Status: AC
  Administered 2019-11-14: 50 ug via INTRAVENOUS
  Filled 2019-11-14: qty 2

## 2019-11-14 MED ORDER — ONDANSETRON HCL 4 MG/2ML IJ SOLN
4.0000 mg | Freq: Once | INTRAMUSCULAR | Status: AC
  Administered 2019-11-14: 4 mg via INTRAVENOUS
  Filled 2019-11-14: qty 2

## 2019-11-14 MED ORDER — ACETAMINOPHEN 325 MG PO TABS
650.0000 mg | ORAL_TABLET | Freq: Once | ORAL | Status: AC | PRN
  Administered 2019-11-14: 650 mg via ORAL
  Filled 2019-11-14: qty 2

## 2019-11-14 MED ORDER — LOPERAMIDE HCL 2 MG PO CAPS: 2.0000 mg | ORAL_CAPSULE | Freq: Four times a day (QID) | ORAL | 0 refills | Status: AC | PRN

## 2019-11-14 MED ORDER — SODIUM CHLORIDE 0.9 % IV SOLN: Freq: Once | INTRAVENOUS | Status: AC

## 2019-11-14 MED ORDER — SODIUM CHLORIDE (PF) 0.9 % IJ SOLN
INTRAMUSCULAR | Status: AC
  Filled 2019-11-14: qty 50

## 2019-11-14 MED ORDER — SODIUM CHLORIDE 0.9% FLUSH: 3.0000 mL | Freq: Once | INTRAVENOUS | Status: DC

## 2019-11-14 MED ORDER — ONDANSETRON 4 MG PO TBDP
4.0000 mg | ORAL_TABLET | Freq: Three times a day (TID) | ORAL | 0 refills | Status: AC | PRN
Start: 2019-11-14 — End: ?

## 2019-11-14 NOTE — ED Provider Notes (Signed)
[facility]-EMERGENCY DEPT Provider Note   CSN: [id number] Arrival date & time: 11/14/19  1043     History Chief Complaint  Patient presents with  . Fever  . Diarrhea  . Generalized Body Aches    Nicole Humphrey is a 44 y.o. female with history of anemia, ventral hernia status post repair in 2013 presents for evaluation of acute onset, progressively worsening fevers, myalgias, generalized body aches for 3 days.  She also notes nausea, vomiting, and diarrhea and states she has been able to keep down any food or fluids in the last 3 days.  Denies suspicious food intake or sick contacts, no known Covid exposures.  She is not vaccinated against Covid.  Denies abdominal pain, urinary symptoms, vaginal itching, bleeding, or discharge.  Notes chest tightness and shortness of breath particularly with exertion and going up steps.  Has a nonproductive cough.  Has been taking Tylenol for symptoms without relief.  She is a former smoker.  The history is provided by the patient.       Past Medical History:  Diagnosis Date  . Anemia 2008  . Hernia   . No pertinent past medical history   . Rash    left fore arm and left scalp   . Shingles 07/17/2011    Patient Active Problem List   Diagnosis Date Noted  . Recurrent ventral hernia 01/24/2013  . Ventral hernia 07/09/2011  . Term birth of newborn 01/20/2011    Past Surgical History:  Procedure Laterality Date  . CESAREAN SECTION  oct 2012, 2008   x 2  . SCAR REVISION  01/17/2011   Procedure: SCAR REVISION;  Surgeon: Purcell NailsAngela Y Roberts, MD;  Location: [facility];  Service: Gynecology;  Laterality: N/A;  . VENTRAL HERNIA REPAIR  07/04/2011   Procedure: LAPAROSCOPIC VENTRAL HERNIA;  Surgeon: Ernestene MentionHaywood M Ingram, MD;  Location: [facility];  Service: General;  Laterality: N/A;  laparoscopic repair ventral hernias with mesh,enterorrhaphapy x1      OB History    Gravida  3   Para  3   Term  3   Preterm      AB      Living  3      SAB      TAB      Ectopic      Multiple      Live Births  1           Family History  Problem Relation Age of Onset  . Heart disease Father     Social History   Tobacco Use  . Smoking status: Former Smoker    Packs/day: 0.25    Years: 3.00    Pack years: 0.75    Quit date: 04/13/2007    Years since quitting: 12.6  . Smokeless tobacco: Never Used  Vaping Use  . Vaping Use: Never used  Substance Use Topics  . Alcohol use: Yes  . Drug use: No    Home Medications Prior to Admission medications   Medication Sig Start Date End Date Taking? Authorizing Provider  famotidine (PEPCID) 40 MG tablet Take 1 tablet (40 mg total) by mouth daily for 14 days. 12/10/17 12/24/17  McDonald, Mia A, PA-C  loperamide (IMODIUM) 2 MG capsule Take 1 capsule (2 mg total) by mouth 4 (four) times daily as needed for diarrhea or loose stools. 11/14/19   Luevenia MaxinFawze, Aaditya Letizia A, PA-C  ondansetron (ZOFRAN ODT) 4 MG disintegrating tablet Take 1 tablet (4 mg total) by  mouth every 8 (eight) hours as needed for nausea or vomiting. 11/14/19   Michela Pitcher A, PA-C    Allergies    Patient has no known allergies.  Review of Systems   Review of Systems  Constitutional: Positive for chills and fever.  Respiratory: Positive for cough, chest tightness and shortness of breath.   Gastrointestinal: Positive for diarrhea, nausea and vomiting. Negative for abdominal pain.  Genitourinary: Negative for dysuria, frequency, hematuria, urgency, vaginal bleeding, vaginal discharge and vaginal pain.  Musculoskeletal: Positive for myalgias.  Neurological: Positive for weakness (generalized).  All other systems reviewed and are negative.   Physical Exam Updated Vital Signs BP 101/60   Pulse 81   Temp 98.6 F (37 C) (Oral)   Resp 14   Ht 5\' 4"  (1.626 m)   Wt 59 kg   LMP 11/07/2019 (Approximate)   SpO2 98%   BMI 22.31 kg/m   Physical Exam Vitals and nursing note reviewed.  Constitutional:      Appearance: She is  well-developed. She is ill-appearing.  HENT:     Head: Normocephalic and atraumatic.  Eyes:     General:        Right eye: No discharge.        Left eye: No discharge.     Conjunctiva/sclera: Conjunctivae normal.  Neck:     Vascular: No JVD.     Trachea: No tracheal deviation.  Cardiovascular:     Rate and Rhythm: Normal rate and regular rhythm.     Pulses: Normal pulses.     Heart sounds: Normal heart sounds.  Pulmonary:     Comments: SPO2 saturations 100% on room air. Abdominal:     General: A surgical scar is present. Bowel sounds are normal. There is no distension.     Palpations: Abdomen is soft.     Tenderness: There is abdominal tenderness in the right lower quadrant, suprapubic area and left lower quadrant. There is no right CVA tenderness, left CVA tenderness, guarding or rebound.  Skin:    General: Skin is warm.     Findings: No erythema.  Neurological:     Mental Status: She is alert.  Psychiatric:        Behavior: Behavior normal.     ED Results / Procedures / Treatments   Labs (all labs ordered are listed, but only abnormal results are displayed) Labs Reviewed  SARS CORONAVIRUS 2 BY RT PCR (HOSPITAL ORDER, PERFORMED IN [facility]) - Abnormal; Notable for the following components:      Result Value   SARS Coronavirus 2 POSITIVE (*)    All other components within normal limits  COMPREHENSIVE METABOLIC PANEL - Abnormal; Notable for the following components:   Sodium 134 (*)    Calcium 8.6 (*)    All other components within normal limits  URINALYSIS, ROUTINE W REFLEX MICROSCOPIC - Abnormal; Notable for the following components:   Color, Urine AMBER (*)    APPearance HAZY (*)    Specific Gravity, Urine 1.034 (*)    Ketones, ur 80 (*)    Protein, ur 100 (*)    Bacteria, UA RARE (*)    All other components within normal limits  CBC WITH DIFFERENTIAL/PLATELET - Abnormal; Notable for the following components:   Hemoglobin 8.7 (*)    HCT 29.5 (*)     MCV 63.7 (*)    MCH 18.8 (*)    MCHC 29.5 (*)    RDW 20.4 (*)    All other  components within normal limits  LIPASE, BLOOD  I-STAT BETA HCG BLOOD, ED ([facility], [facility], [facility] ONLY)    EKG EKG Interpretation  Date/Time:  Friday November 14 2019 17:58:46 EDT Ventricular Rate:  88 PR Interval:    QRS Duration: 66 QT Interval:  338 QTC Calculation: 409 R Axis:   56 Text Interpretation: Sinus rhythm Atrial premature complex Probable left atrial enlargement Low voltage, precordial leads No STEMI Confirmed by Alvester Chou [id number]) on 11/14/2019 6:01:12 PM   Radiology DG Chest Portable 1 View  Result Date: 11/14/2019 CLINICAL DATA:  Short of breath and cough with fever EXAM: PORTABLE CHEST 1 VIEW COMPARISON:  None. FINDINGS: The heart size and mediastinal contours are within normal limits. Both lungs are clear. The visualized skeletal structures are unremarkable. IMPRESSION: No active disease. Electronically Signed   By: Marlan Palau M.D.   On: 11/14/2019 17:03    Procedures Procedures (including critical care time)  Medications Ordered in ED Medications  acetaminophen (TYLENOL) tablet 650 mg (650 mg Oral Given 11/14/19 1103)  0.9 %  sodium chloride infusion ( Intravenous Stopped 11/14/19 1910)  ondansetron (ZOFRAN) injection 4 mg (4 mg Intravenous Given 11/14/19 1632)  fentaNYL (SUBLIMAZE) injection 50 mcg (50 mcg Intravenous Given 11/14/19 1632)    ED Course  I have reviewed the triage vital signs and the nursing notes.  Pertinent labs & imaging results that were available during my care of the patient were reviewed by me and considered in my medical decision making (see chart for details).  Clinical Course as of Nov 14 1098  Fri Nov 14, 2019  1842 44 yo female prsenting with viral illness x 3 days, with nausea, vomiting, diarrhea, fevers, myalgia.  Found to be covid positive here.  She is not vaccinated.  Labs show CMP unremarkable, CBC with some anemia (no recent baseline, like microcytic  chronic condition), UA without infection.  Lipase wnl.  On exam she has a small stature, has no focal abdominal ttp for be, lungs clear, 100% on room air oxygen sats.  Plan to treat with 1L IVF bolus, gave nausea medications, will PO challenge.  If able to drink, she can be discharged home.  I doubt sepsis, acute intraabdominal or pelvic infection or surgical emergency at this time.   [MT]    Clinical Course User Index [MT] Trifan, Kermit Balo, MD   MDM Rules/Calculators/A&P                          Nicole Humphrey was evaluated in Emergency Department on 11/15/2019 for the symptoms described in the history of present illness. She was evaluated in the context of the global COVID-19 pandemic, which necessitated consideration that the patient might be at risk for infection with the SARS-CoV-2 virus that causes COVID-19. Institutional protocols and algorithms that pertain to the evaluation of patients at risk for COVID-19 are in a state of rapid change based on information released by regulatory bodies including the CDC and federal and state organizations. These policies and algorithms were followed during the patient's care in the ED.  Patient presenting for evaluation of fevers, myalgias, nausea, vomiting, diarrhea, chest pain, shortness of breath, and cough for 3 days. No known sick contacts. No recent travel or suspicious food intake. She is febrile in the ED and initially tachycardic with improvement after administration of Tylenol. She appears ill, complaining of malaise. She is speaking in full sentences with normal SPO2 saturations at rest and with  ambulation. Lungs are clear to auscultation bilaterally. She denies abdominal pain but on assessment has mild lower abdominal discomfort on palpation but no rebound or guarding. Denies vaginal itching, bleeding, discharge or urinary symptoms and I doubt PID, TOA, ovarian torsion, ectopic pregnancy. Her Covid test here is positive. Remainder of lab work reviewed  and interpreted by myself shows no leukocytosis, anemia with hemoglobin of 8.7 though difficult to assess chronicity as most recent CBC is from years ago. No renal insufficiency or metabolic derangements. Her UA suggest dehydration but less concerning for UTI or nephrolithiasis. Chest x-ray shows no acute cardiopulmonary abnormalities.  In the ED the patient received IV fluids as her blood pressure was soft and her work-up was suggestive of dehydration. Blood pressure improved on reassessment. She also received IV Zofran for nausea and fentanyl for pain. On reevaluation she is resting comfortably reports that she is feeling much better. She is tolerating p.o. fluids. Serial abdominal examinations remain benign. I have a low suspicion of acute surgical abdominal pathology at this time and feel that her symptoms are most likely in the setting of her COVID-19 infection. Discussed quarantining at home per current CDC guidelines. Recommend purchasing a pulse oximeter to monitor O2 saturations at home. Discussed strict ED return precautions. Patient verbalized understanding of and agreement with plan and is stable for discharge at this time. Patient seen and evaluated by Dr. Renaye Rakers who agrees with assessment and plan at this time.   Final Clinical Impression(s) / ED Diagnoses Final diagnoses:  COVID-19  Dehydration  Nausea vomiting and diarrhea    Rx / DC Orders ED Discharge Orders         Ordered    ondansetron (ZOFRAN ODT) 4 MG disintegrating tablet  Every 8 hours PRN     Discontinue  Reprint     11/14/19 2048    loperamide (IMODIUM) 2 MG capsule  4 times daily PRN     Discontinue  Reprint     11/14/19 2048           Jeanie Sewer, PA-C 11/15/19 1103    Terald Sleeper, MD 11/15/19 [phone]

## 2019-11-14 NOTE — ED Triage Notes (Signed)
Patient c/o fever, body aches, and a fever x 2 days.

## 2019-11-14 NOTE — ED Notes (Signed)
XR at bedside

## 2019-11-14 NOTE — Discharge Instructions (Signed)
Your Covid test today was positive.  Please quarantine at home for at least 10 days from when your symptoms began.  If at the 10-day mark you have been fever free without the use of ibuprofen or Tylenol and your symptoms are improving then you can return to work and stop quarantining.  Otherwise you will need to continue quarantine until these conditions are met.  You can take ibuprofen or Tylenol as needed for aches pains or fevers.  Follow the recommendations on the back of the packaging.  Take Zofran as needed for nausea and vomiting.  Let this medicine dissolve under your tongue and wait around 10 or 15 minutes before you have anything to eat or drink to give this medicine time to work.  Take Imodium as needed for diarrhea.  Drink plenty of water and get rest.  I would recommend purchasing a device called a pulse oximeter which can check your oxygen levels.  Anything below 92% is abnormal.  If your oxygen levels are low, seek immediate evaluation.  Follow-up with your primary care provider for reevaluation of your symptoms, return to the emergency department if any concerning signs or symptoms develop such as low oxygen saturations, worsening shortness of breath or chest pain, loss of consciousness, persistent vomiting, blood in the urine or stool, or persistently elevated fevers.

## 2019-11-14 NOTE — ED Notes (Signed)
ED Provider at bedside. 

## 2019-11-14 NOTE — ED Notes (Signed)
Patient given sprite for PO challenge per MD 

## 2019-11-15 ENCOUNTER — Telehealth: Payer: Self-pay | Admitting: Surgery

## 2019-11-15 NOTE — Telephone Encounter (Signed)
ED CM attempt to contact patient who tested positive for Covid -19 yesterday. No answer unable to leave VM. CM will make 2nd attempt later

## 2019-11-19 ENCOUNTER — Emergency Department (HOSPITAL_COMMUNITY)
Admission: EM | Admit: 2019-11-19 | Discharge: 2019-11-20 | Disposition: A | Discharge status: Home / Self Care | Payer: HRSA Program | Attending: Emergency Medicine | Admitting: Emergency Medicine

## 2019-11-19 ENCOUNTER — Emergency Department (HOSPITAL_COMMUNITY): Payer: HRSA Program

## 2019-11-19 DIAGNOSIS — U071 COVID-19: Secondary | ICD-10-CM | POA: Insufficient documentation

## 2019-11-19 DIAGNOSIS — Z87891 Personal history of nicotine dependence: Secondary | ICD-10-CM | POA: Insufficient documentation

## 2019-11-19 DIAGNOSIS — R0602 Shortness of breath: Secondary | ICD-10-CM

## 2019-11-19 LAB — CBC WITH DIFFERENTIAL/PLATELET
Abs Immature Granulocytes: 0.01 10*3/uL (ref 0.00–0.07)
Basophils Absolute: 0 10*3/uL (ref 0.0–0.1)
Basophils Relative: 0 %
Eosinophils Absolute: 0 10*3/uL (ref 0.0–0.5)
Eosinophils Relative: 0 %
HCT: 29.6 % — ABNORMAL LOW (ref 36.0–46.0)
Hemoglobin: 8.8 g/dL — ABNORMAL LOW (ref 12.0–15.0)
Immature Granulocytes: 0 %
Lymphocytes Relative: 25 %
Lymphs Abs: 0.9 10*3/uL (ref 0.7–4.0)
MCH: 18.7 pg — ABNORMAL LOW (ref 26.0–34.0)
MCHC: 29.7 g/dL — ABNORMAL LOW (ref 30.0–36.0)
MCV: 63 fL — ABNORMAL LOW (ref 80.0–100.0)
Monocytes Absolute: 0.2 10*3/uL (ref 0.1–1.0)
Monocytes Relative: 6 %
Neutro Abs: 2.5 10*3/uL (ref 1.7–7.7)
Neutrophils Relative %: 69 %
Platelets: 184 10*3/uL (ref 150–400)
RBC: 4.7 MIL/uL (ref 3.87–5.11)
RDW: 20.5 % — ABNORMAL HIGH (ref 11.5–15.5)
WBC: 3.6 10*3/uL — ABNORMAL LOW (ref 4.0–10.5)
nRBC: 0 % (ref 0.0–0.2)

## 2019-11-19 LAB — COMPREHENSIVE METABOLIC PANEL
ALT: 12 U/L (ref 0–44)
AST: 28 U/L (ref 15–41)
Albumin: 3.2 g/dL — ABNORMAL LOW (ref 3.5–5.0)
Alkaline Phosphatase: 43 U/L (ref 38–126)
Anion gap: 8 (ref 5–15)
BUN: 6 mg/dL (ref 6–20)
CO2: 27 mmol/L (ref 22–32)
Calcium: 8.5 mg/dL — ABNORMAL LOW (ref 8.9–10.3)
Chloride: 100 mmol/L (ref 98–111)
Creatinine, Ser: 0.59 mg/dL (ref 0.44–1.00)
GFR calc Af Amer: >60 mL/min (ref >60)
GFR calc non Af Amer: >60 mL/min (ref >60)
Glucose, Bld: 92 mg/dL (ref 70–99)
Potassium: 3.5 mmol/L (ref 3.5–5.1)
Sodium: 135 mmol/L (ref 135–145)
Total Bilirubin: 0.5 mg/dL (ref 0.3–1.2)
Total Protein: 7 g/dL (ref 6.5–8.1)

## 2019-11-19 MED ORDER — SODIUM CHLORIDE 0.9 % IV BOLUS
1000.0000 mL | Freq: Once | INTRAVENOUS | Status: AC
  Administered 2019-11-19: 1000 mL via INTRAVENOUS

## 2019-11-19 NOTE — ED Provider Notes (Signed)
[facility] EMERGENCY DEPARTMENT Provider Note   CSN: [id number] Arrival date & time: 11/19/19  2003     History Chief Complaint  Patient presents with   Covid Exposure   Panic Attack    Nicole Humphrey is a 44 y.o. female.  Patient reports diagnosed with COVID on Aug 6th.  Since that time she has been having nausea and vomiting.  She reports not being able to keep things down including fluids.  She reports worsening shortness of breath today so she called EMS who on assessment reported that she was having a panic attack.  She denies any chest pain and reports this did not feel like her usual panic attack.  Endorses temperatures at home 101 at home and has been taking Tylenol when she feels bad.  Last took Tylenol about 1 hr prior to coming to ED.  Reports anosmia and ageusia.  Has not been vaccinated and reports no recent sick contacts.          Past Medical History:  Diagnosis Date   Anemia 2008   Hernia    No pertinent past medical history    Rash    left fore arm and left scalp    Shingles 07/17/2011    Patient Active Problem List   Diagnosis Date Noted   Recurrent ventral hernia 01/24/2013   Ventral hernia 07/09/2011   Term birth of newborn 01/20/2011    Past Surgical History:  Procedure Laterality Date   CESAREAN SECTION  oct 2012, 2008   x 2   SCAR REVISION  01/17/2011   Procedure: SCAR REVISION;  Surgeon: Purcell Nails, MD;  Location: [facility];  Service: Gynecology;  Laterality: N/A;   VENTRAL HERNIA REPAIR  07/04/2011   Procedure: LAPAROSCOPIC VENTRAL HERNIA;  Surgeon: Ernestene Mention, MD;  Location: [facility];  Service: General;  Laterality: N/A;  laparoscopic repair ventral hernias with mesh,enterorrhaphapy x1      OB History    Gravida  3   Para  3   Term  3   Preterm      AB      Living  3     SAB      TAB      Ectopic      Multiple      Live Births  1           Family History  Problem Relation Age  of Onset   Heart disease Father     Social History   Tobacco Use   Smoking status: Former Smoker    Packs/day: 0.25    Years: 3.00    Pack years: 0.75    Quit date: 04/13/2007    Years since quitting: 12.6   Smokeless tobacco: Never Used  Vaping Use   Vaping Use: Never used  Substance Use Topics   Alcohol use: Yes   Drug use: No    Home Medications Prior to Admission medications   Medication Sig Start Date End Date Taking? Authorizing Provider  famotidine (PEPCID) 40 MG tablet Take 1 tablet (40 mg total) by mouth daily for 14 days. 12/10/17 12/24/17  McDonald, Mia A, PA-C  loperamide (IMODIUM) 2 MG capsule Take 1 capsule (2 mg total) by mouth 4 (four) times daily as needed for diarrhea or loose stools. 11/14/19   Luevenia Maxin, Mina A, PA-C  ondansetron (ZOFRAN ODT) 4 MG disintegrating tablet Take 1 tablet (4 mg total) by mouth every 8 (eight) hours as needed for  nausea or vomiting. 11/14/19   Michela Pitcher A, PA-C    Allergies    Patient has no known allergies.  Review of Systems   Review of Systems  Constitutional: Positive for appetite change and fever.  Respiratory: Positive for shortness of breath.   Cardiovascular: Negative for chest pain.  Gastrointestinal: Positive for diarrhea, nausea and vomiting.  Genitourinary: Negative for difficulty urinating.    Physical Exam Updated Vital Signs BP 106/74    Pulse 88    Temp 99.5 F (37.5 C) (Oral)    Resp (!) 24    LMP 11/07/2019 (Approximate)    SpO2 95%   Physical Exam Constitutional:      Appearance: Normal appearance.  HENT:     Head: Normocephalic.     Nose: Nose normal.     Mouth/Throat:     Mouth: Mucous membranes are moist.  Eyes:     Extraocular Movements: Extraocular movements intact.     Pupils: Pupils are equal, round, and reactive to light.  Cardiovascular:     Rate and Rhythm: Normal rate and regular rhythm.  Pulmonary:     Effort: Pulmonary effort is normal.     Breath sounds: Normal breath sounds.    Abdominal:     General: Abdomen is flat. Bowel sounds are normal.     Palpations: Abdomen is soft.  Musculoskeletal:     Cervical back: Normal range of motion and neck supple.  Skin:    General: Skin is warm.     Capillary Refill: Capillary refill takes less than 2 seconds.  Neurological:     General: No focal deficit present.     Mental Status: She is alert and oriented to person, place, and time.     ED Results / Procedures / Treatments   Labs (all labs ordered are listed, but only abnormal results are displayed) Labs Reviewed  CBC WITH DIFFERENTIAL/PLATELET - Abnormal; Notable for the following components:      Result Value   WBC 3.6 (*)    Hemoglobin 8.8 (*)    HCT 29.6 (*)    MCV 63.0 (*)    MCH 18.7 (*)    MCHC 29.7 (*)    RDW 20.5 (*)    All other components within normal limits  COMPREHENSIVE METABOLIC PANEL - Abnormal; Notable for the following components:   Calcium 8.5 (*)    Albumin 3.2 (*)    All other components within normal limits    EKG EKG Interpretation  Date/Time:  Wednesday November 19 2019 21:14:38 EDT Ventricular Rate:  69 PR Interval:    QRS Duration: 62 QT Interval:  374 QTC Calculation: 401 R Axis:   57 Text Interpretation: Sinus rhythm No significant change since last tracing Confirmed by Richardean Canal [phone]) on 11/19/2019 9:35:04 PM   Radiology DG CHEST PORT 1 VIEW  Result Date: 11/19/2019 CLINICAL DATA:  44 year old female positive COVID-19. Increasing symptoms. Former smoker. EXAM: PORTABLE CHEST 1 VIEW COMPARISON:  Portable chest 11/14/2019. FINDINGS: Portable AP semi upright view at 2030 hours. Lung volumes and mediastinal contours remain normal. Visualized tracheal air column is within normal limits. No pneumothorax, pleural effusion, or consolidation. Mild asymmetric increased pulmonary interstitial markings are stable. No osseous abnormality identified. Negative visible bowel gas pattern. IMPRESSION: Borderline to mild pulmonary  interstitial markings might be chronic due to smoking, or could reflect mild viral pneumonia in this setting. Electronically Signed   By: Odessa Fleming M.D.   On: 11/19/2019 20:39  Procedures Procedures (including critical care time)  Medications Ordered in ED Medications  sodium chloride 0.9 % bolus 1,000 mL (0 mLs Intravenous Stopped 11/19/19 2221)    ED Course  I have reviewed the triage vital signs and the nursing notes.  Pertinent labs & imaging results that were available during my care of the patient were reviewed by me and considered in my medical decision making (see chart for details).    MDM Rules/Calculators/A&P                          Nicole Humphrey is a 44 y.o. female who presented to the ED for worsening shortness of breath in the setting of recent positive COVID test 08/06.  Chest xray shows mild viral pneumonia.  She is not hypoxic and did not require any oxygen while in the ED. Lung sounds clear on exam and no use of accessory muscles. Labs show mild leukopenia likely secondary to viral infection otherwise stable.     Final Clinical Impression(s) / ED Diagnoses Final diagnoses:  SOB (shortness of breath)  COVID-19    Rx / DC Orders ED Discharge Orders    None       Dana Allan, MD 11/19/19 2240    Charlynne Pander, MD 11/19/19 2250

## 2019-11-19 NOTE — ED Triage Notes (Signed)
Pt arrives via [facility] from scene after pt was on her way to ER and started having shortness of breath. Pt was diagnosed with covid on the 6th and has had worsening symtoms. Upon EMS arrival pt was having a panic attack. EMS was able to calm pt. Pt stable upon entering ED.

## 2019-11-19 NOTE — Discharge Instructions (Addendum)
Continue to self isolate for 10 days from the time of diagnosis and no temperature for 24 hours without taking Tylenol.  You can take Tylenol for discomfort.      Person Under Monitoring Name: Nicole Humphrey  Location: [address] [place] [address]   Infection Prevention Recommendations for Individuals Confirmed to have, or Being Evaluated for, 2019 Novel Coronavirus (COVID-19) Infection Who Receive Care at Home  Individuals who are confirmed to have, or are being evaluated for, COVID-19 should follow the prevention steps below until a healthcare provider or local or state health department says they can return to normal activities.  Stay home except to get medical care You should restrict activities outside your home, except for getting medical care. Do not go to work, school, or public areas, and do not use public transportation or taxis.  Call ahead before visiting your doctor Before your medical appointment, call the healthcare provider and tell them that you have, or are being evaluated for, COVID-19 infection. This will help the healthcare provider's office take steps to keep other people from getting infected. Ask your healthcare provider to call the local or state health department.  Monitor your symptoms Seek prompt medical attention if your illness is worsening (e.g., difficulty breathing). Before going to your medical appointment, call the healthcare provider and tell them that you have, or are being evaluated for, COVID-19 infection. Ask your healthcare provider to call the local or state health department.  Wear a facemask You should wear a facemask that covers your nose and mouth when you are in the same room with other people and when you visit a healthcare provider. People who live with or visit you should also wear a facemask while they are in the same room with you.  Separate yourself from other people in your home As much as possible, you should stay  in a different room from other people in your home. Also, you should use a separate bathroom, if available.  Avoid sharing household items You should not share dishes, drinking glasses, cups, eating utensils, towels, bedding, or other items with other people in your home. After using these items, you should wash them thoroughly with soap and water.  Cover your coughs and sneezes Cover your mouth and nose with a tissue when you cough or sneeze, or you can cough or sneeze into your sleeve. Throw used tissues in a lined trash can, and immediately wash your hands with soap and water for at least 20 seconds or use an alcohol-based hand rub.  Wash your [employer] your hands often and thoroughly with soap and water for at least 20 seconds. You can use an alcohol-based hand sanitizer if soap and water are not available and if your hands are not visibly dirty. Avoid touching your eyes, nose, and mouth with unwashed hands.   Prevention Steps for Caregivers and Household Members of Individuals Confirmed to have, or Being Evaluated for, COVID-19 Infection Being Cared for in the Home  If you live with, or provide care at home for, a person confirmed to have, or being evaluated for, COVID-19 infection please follow these guidelines to prevent infection:  Follow healthcare provider's instructions Make sure that you understand and can help the patient follow any healthcare provider instructions for all care.  Provide for the patient's basic needs You should help the patient with basic needs in the home and provide support for getting groceries, prescriptions, and other personal needs.  Monitor the patient's symptoms If  they are getting sicker, call his or her medical provider and tell them that the patient has, or is being evaluated for, COVID-19 infection. This will help the healthcare provider's office take steps to keep other people from getting infected. Ask the healthcare provider to call the  local or state health department.  Limit the number of people who have contact with the patient If possible, have only one caregiver for the patient. Other household members should stay in another home or place of residence. If this is not possible, they should stay in another room, or be separated from the patient as much as possible. Use a separate bathroom, if available. Restrict visitors who do not have an essential need to be in the home.  Keep older adults, very young children, and other sick people away from the patient Keep older adults, very young children, and those who have compromised immune systems or chronic health conditions away from the patient. This includes people with chronic heart, lung, or kidney conditions, diabetes, and cancer.  Ensure good ventilation Make sure that shared spaces in the home have good air flow, such as from an air conditioner or an opened window, weather permitting.  Wash your hands often Wash your hands often and thoroughly with soap and water for at least 20 seconds. You can use an alcohol based hand sanitizer if soap and water are not available and if your hands are not visibly dirty. Avoid touching your eyes, nose, and mouth with unwashed hands. Use disposable paper towels to dry your hands. If not available, use dedicated cloth towels and replace them when they become wet.  Wear a facemask and gloves Wear a disposable facemask at all times in the room and gloves when you touch or have contact with the patient's blood, body fluids, and/or secretions or excretions, such as sweat, saliva, sputum, nasal mucus, vomit, urine, or feces.  Ensure the mask fits over your nose and mouth tightly, and do not touch it during use. Throw out disposable facemasks and gloves after using them. Do not reuse. Wash your hands immediately after removing your facemask and gloves. If your personal clothing becomes contaminated, carefully remove clothing and launder. Wash  your hands after handling contaminated clothing. Place all used disposable facemasks, gloves, and other waste in a lined container before disposing them with other household waste. Remove gloves and wash your hands immediately after handling these items.  Do not share dishes, glasses, or other household items with the patient Avoid sharing household items. You should not share dishes, drinking glasses, cups, eating utensils, towels, bedding, or other items with a patient who is confirmed to have, or being evaluated for, COVID-19 infection. After the person uses these items, you should wash them thoroughly with soap and water.  Wash laundry thoroughly Immediately remove and wash clothes or bedding that have blood, body fluids, and/or secretions or excretions, such as sweat, saliva, sputum, nasal mucus, vomit, urine, or feces, on them. Wear gloves when handling laundry from the patient. Read and follow directions on labels of laundry or clothing items and detergent. In general, wash and dry with the warmest temperatures recommended on the label.  Clean all areas the individual has used often Clean all touchable surfaces, such as counters, tabletops, doorknobs, bathroom fixtures, toilets, phones, keyboards, tablets, and bedside tables, every day. Also, clean any surfaces that may have blood, body fluids, and/or secretions or excretions on them. Wear gloves when cleaning surfaces the patient has come in contact with. Use  a diluted bleach solution (e.g., dilute bleach with 1 part bleach and 10 parts water) or a household disinfectant with a label that says EPA-registered for coronaviruses. To make a bleach solution at home, add 1 tablespoon of bleach to 1 quart (4 cups) of water. For a larger supply, add  cup of bleach to 1 gallon (16 cups) of water. Read labels of cleaning products and follow recommendations provided on product labels. Labels contain instructions for safe and effective use of the  cleaning product including precautions you should take when applying the product, such as wearing gloves or eye protection and making sure you have good ventilation during use of the product. Remove gloves and wash hands immediately after cleaning.  Monitor yourself for signs and symptoms of illness Caregivers and household members are considered close contacts, should monitor their health, and will be asked to limit movement outside of the home to the extent possible. Follow the monitoring steps for close contacts listed on the symptom monitoring form.   ? If you have additional questions, contact your local health department or call the epidemiologist on call at [phone] (available 24/7). ? This guidance is subject to change. For the most up-to-date guidance from [facility], please refer to their website: [email or web address]

## 2019-11-20 LAB — PATHOLOGIST SMEAR REVIEW

## 2022-07-30 IMAGING — DX DG CHEST 1V PORT
1 series · 1 of 1 positions shown · non-contrast
Comparison: Portable chest 11/14/2019.

CLINICAL DATA: 44-year-old female positive [id number]. Increasing
symptoms. Former smoker.

EXAM:
PORTABLE CHEST 1 VIEW

[chest]
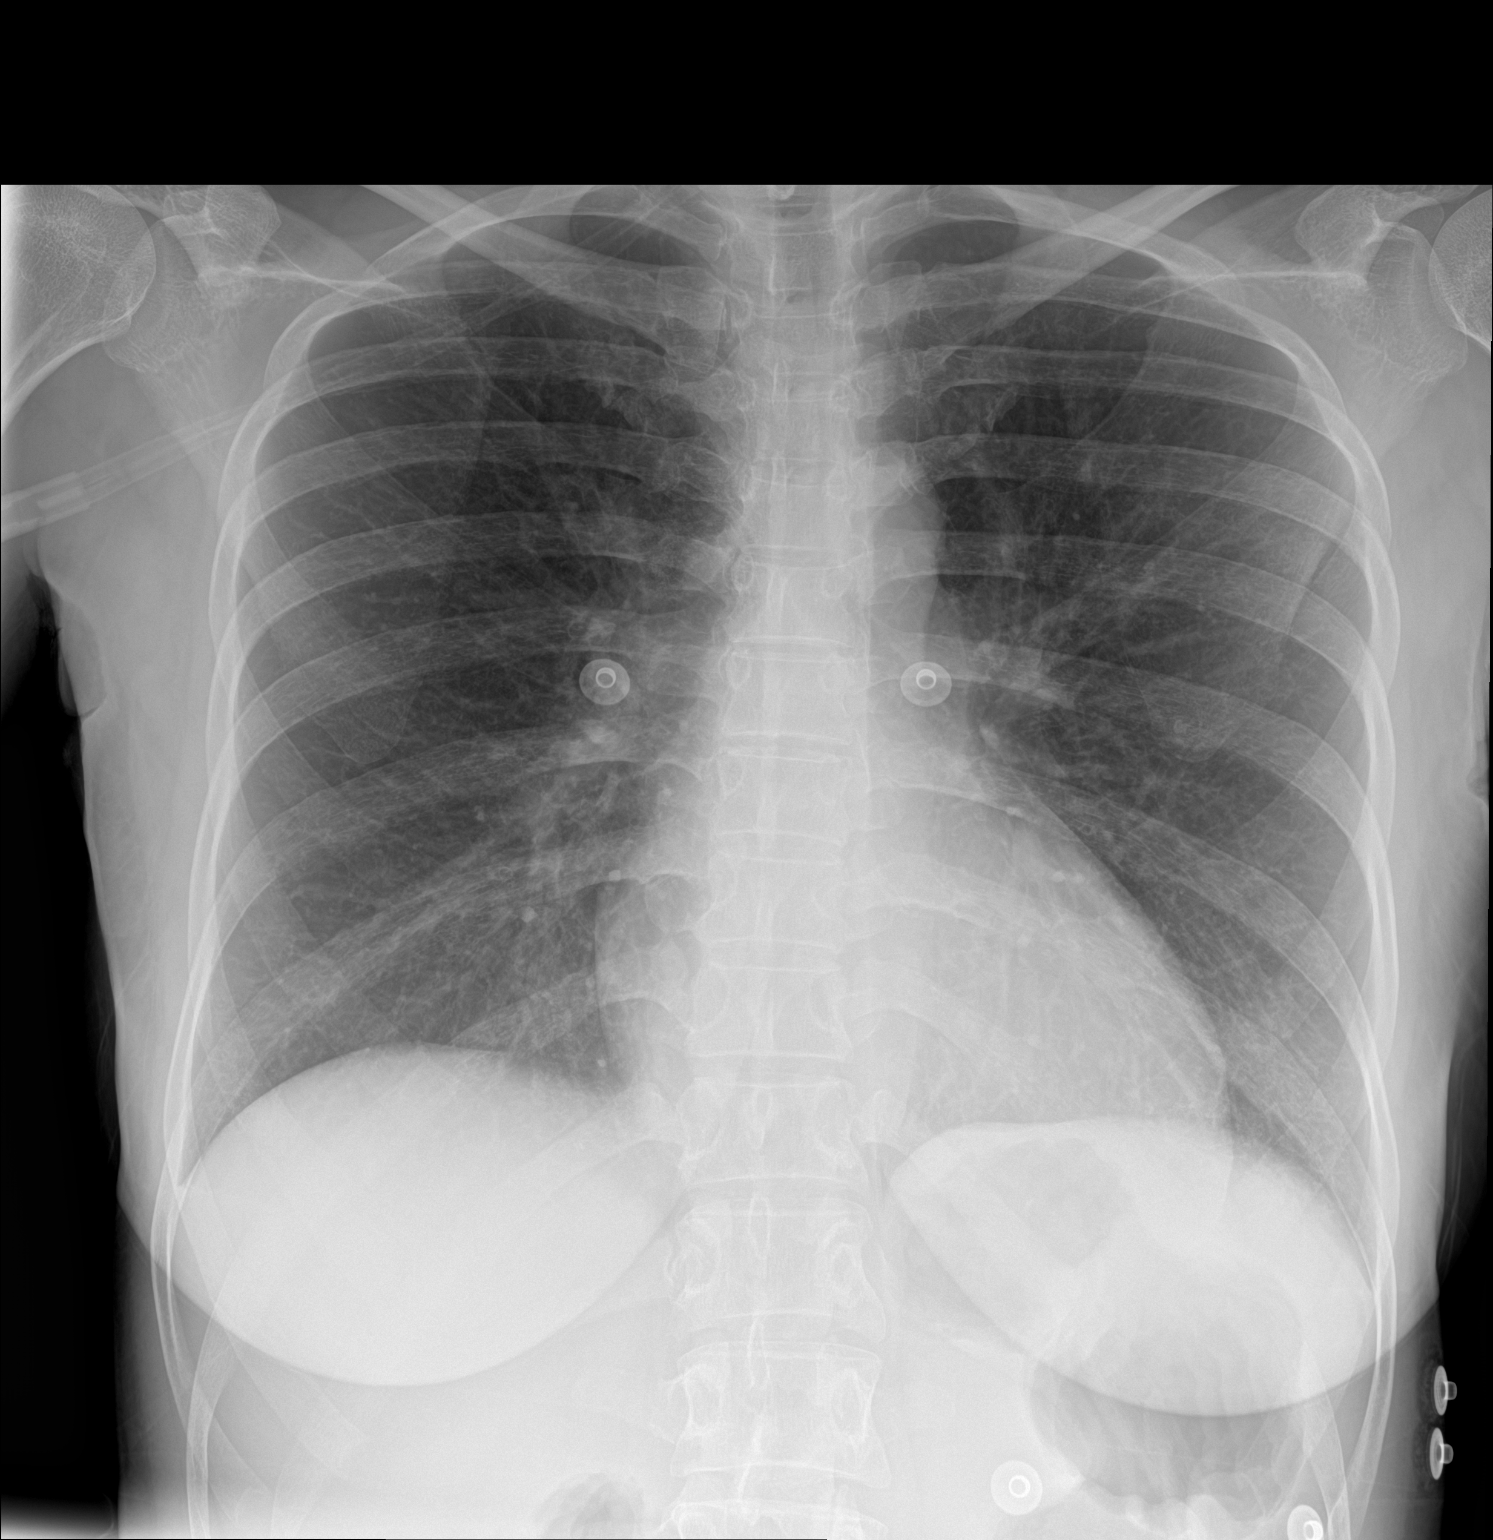

[1 of 1 positions shown; findings below may reference images not displayed]

FINDINGS: Portable AP semi upright view at 4616 hours. Lung volumes and
mediastinal contours remain normal. Visualized tracheal air column
is within normal limits. No pneumothorax, pleural effusion, or
consolidation. Mild asymmetric increased pulmonary interstitial
markings are stable. No osseous abnormality identified. Negative
visible bowel gas pattern.
IMPRESSION: Borderline to mild pulmonary interstitial markings might be chronic
due to smoking, or could reflect mild viral pneumonia in this
setting.
